# Patient Record
Sex: Male | Born: 1985 | Race: White | Hispanic: No | Marital: Single | Smoking: Current some day smoker
Health system: Southern US, Community
[De-identification: ages and names within clinical notes are randomized; demographics above are authoritative.]

## PROBLEM LIST (undated history)

## (undated) ENCOUNTER — Emergency Department (HOSPITAL_COMMUNITY): Discharge status: Absent without leave | Payer: MEDICAID

## (undated) DIAGNOSIS — F1090 Alcohol use, unspecified, uncomplicated: Secondary | ICD-10-CM

## (undated) DIAGNOSIS — F1410 Cocaine abuse, uncomplicated: Secondary | ICD-10-CM

## (undated) DIAGNOSIS — Z789 Other specified health status: Secondary | ICD-10-CM

## (undated) DIAGNOSIS — Z7289 Other problems related to lifestyle: Secondary | ICD-10-CM

---
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
## 2005-11-25 ENCOUNTER — Emergency Department (HOSPITAL_COMMUNITY): Admission: EM | Admit: 2005-11-25 | Discharge: 2005-11-25 | Payer: Self-pay | Admitting: Emergency Medicine

## 2007-02-01 ENCOUNTER — Emergency Department (HOSPITAL_COMMUNITY): Admission: EM | Admit: 2007-02-01 | Discharge: 2007-02-01 | Payer: Self-pay | Admitting: Emergency Medicine

## 2009-10-18 ENCOUNTER — Emergency Department: Payer: Self-pay | Admitting: Internal Medicine

## 2011-11-25 ENCOUNTER — Emergency Department (HOSPITAL_COMMUNITY)
Admission: EM | Admit: 2011-11-25 | Discharge: 2011-11-25 | Disposition: A | Discharge status: Home / Self Care | Payer: Self-pay | Attending: Emergency Medicine | Admitting: Emergency Medicine

## 2011-11-25 ENCOUNTER — Encounter (HOSPITAL_COMMUNITY): Payer: Self-pay

## 2011-11-25 DIAGNOSIS — F17200 Nicotine dependence, unspecified, uncomplicated: Secondary | ICD-10-CM | POA: Insufficient documentation

## 2011-11-25 DIAGNOSIS — N342 Other urethritis: Secondary | ICD-10-CM | POA: Insufficient documentation

## 2011-11-25 LAB — URINALYSIS, ROUTINE W REFLEX MICROSCOPIC
Hgb urine dipstick: NEGATIVE
pH: 7.5 (ref 5.0–8.0)

## 2011-11-25 MED ORDER — CEFTRIAXONE SODIUM 250 MG IJ SOLR
250.0000 mg | Freq: Once | INTRAMUSCULAR | Status: AC
  Administered 2011-11-25: 250 mg via INTRAMUSCULAR
  Filled 2011-11-25: qty 250

## 2011-11-25 MED ORDER — DOXYCYCLINE HYCLATE 100 MG PO TABS: 100.0000 mg | ORAL_TABLET | Freq: Two times a day (BID) | ORAL | Status: AC

## 2011-11-25 MED ORDER — DOXYCYCLINE HYCLATE 100 MG PO TABS
100.0000 mg | ORAL_TABLET | Freq: Once | ORAL | Status: AC
  Administered 2011-11-25: 100 mg via ORAL
  Filled 2011-11-25: qty 1

## 2011-11-25 NOTE — ED Notes (Signed)
Pain with urination and pain in left groin area. Pt states it feel like he is peeing glass

## 2011-11-25 NOTE — ED Notes (Signed)
C/o flank pain and painful with urination x 6 days, denies fever/chills, states feels like glass with urination

## 2011-11-25 NOTE — ED Provider Notes (Signed)
History     CSN: [id number]  Arrival date & time 11/25/11  1251   First MD Initiated Contact with Patient 11/25/11 1341      Chief Complaint  Patient presents with  . Dysuria     HPI Pt was seen at 1355.  Per pt, c/o gradual onset and persistence of constant dysuria for the past 3 days.  Describes his dysuria as "it feels like I'm peeing glass."  Has been associated with distal penile "pain."  States he has been taking his significant other's cipro for the past several days for his symptoms.  Denies testicular pain/swelling, no penile rash, no injury, no fevers, no abd pain, no back pain, no N/V/D.      History reviewed. No pertinent past medical history.  History reviewed. No pertinent past surgical history.   History  Substance Use Topics  . Smoking status: Current Everyday Smoker  . Smokeless tobacco: Not on file  . Alcohol Use: Yes    Review of Systems ROS: Statement: All systems negative except as marked or noted in the HPI; Constitutional: Negative for fever and chills. ; ; Eyes: Negative for eye pain, redness and discharge. ; ; ENMT: Negative for ear pain, hoarseness, nasal congestion, sinus pressure and sore throat. ; ; Cardiovascular: Negative for chest pain, palpitations, diaphoresis, dyspnea and peripheral edema. ; ; Respiratory: Negative for cough, wheezing and stridor. ; ; Gastrointestinal: Negative for nausea, vomiting, diarrhea, abdominal pain, blood in stool, hematemesis, jaundice and rectal bleeding. . ; ; Genitourinary: +dysuria. Negative for flank pain and hematuria. ; Genital:  No penile drainage or rash, no testicular pain or swelling, no scrotal rash or swelling. ;; Musculoskeletal: Negative for back pain and neck pain. Negative for swelling and trauma.; ; Skin: Negative for pruritus, rash, abrasions, blisters, bruising and skin lesion.; ; Neuro: Negative for headache, lightheadedness and neck stiffness. Negative for weakness, altered level of consciousness ,  altered mental status, extremity weakness, paresthesias, involuntary movement, seizure and syncope.     Allergies  Erythromycin  Home Medications   Current Outpatient Rx  Name Route Sig Dispense Refill  . DOXYCYCLINE HYCLATE 100 MG PO TABS Oral Take 1 tablet (100 mg total) by mouth 2 (two) times daily. 14 tablet 0    BP 145/63  Pulse 69  Temp 98.3 F (36.8 C) (Oral)  Resp 20  Ht 5\' 10"  (1.778 m)  Wt 198 lb (89.812 kg)  BMI 28.41 kg/m2  SpO2 100%  Physical Exam 1400: Physical examination:  Nursing notes reviewed; Vital signs and O2 SAT reviewed;  Constitutional: Well developed, Well nourished, Well hydrated, In no acute distress; Head:  Normocephalic, atraumatic; Eyes: EOMI, PERRL, No scleral icterus; ENMT: Mouth and pharynx normal, Mucous membranes moist; Neck: Supple, Full range of motion, No lymphadenopathy; Cardiovascular: Regular rate and rhythm, No gallop; Respiratory: Breath sounds clear & equal bilaterally, No wheezes.  Speaking full sentences with ease, Normal respiratory effort/excursion; Chest: Nontender, Movement normal; Abdomen: Soft, Nontender, Nondistended, Normal bowel sounds; Genitourinary: No CVA tenderness; Genital exam performed with pt permission and male ED Tech chaparone present during exam. No perineal erythema.  No penile lesions or rash.  +clear drainage from urethra.  No scrotal erythema, edema or tenderness to palp.  Normal testicular lie.  No testicular tenderness to palp.  +cremasteric reflexes bilat.  No inguinal LAN or palpable masses..;; Extremities: Pulses normal, No tenderness, No edema, No calf edema or asymmetry.; Neuro: AA&Ox3, Major CN grossly intact.  Speech clear. No gross focal motor  or sensory deficits in extremities.; Skin: Color normal, Warm, Dry.   ED Course  Procedures    MDM  MDM Reviewed: nursing note and vitals Interpretation: labs     Results for orders placed during the hospital encounter of 11/25/11  URINALYSIS, ROUTINE W  REFLEX MICROSCOPIC      Component Value Range   Color, Urine YELLOW  YELLOW   APPearance CLEAR  CLEAR   Specific Gravity, Urine 1.020  1.005 - 1.030   pH 7.5  5.0 - 8.0   Glucose, UA NEGATIVE  NEGATIVE mg/dL   Hgb urine dipstick NEGATIVE  NEGATIVE   Bilirubin Urine NEGATIVE  NEGATIVE   Ketones, ur NEGATIVE  NEGATIVE mg/dL   Protein, ur NEGATIVE  NEGATIVE mg/dL   Urobilinogen, UA 0.2  0.0 - 1.0 mg/dL   Nitrite NEGATIVE  NEGATIVE   Leukocytes, UA NEGATIVE  NEGATIVE  GC/CHLAMYDIA PROBE AMP, GENITAL      Component Value Range   GC Probe Amp, Genital NEGATIVE  NEGATIVE   Chlamydia, DNA Probe NEGATIVE  NEGATIVE      5:56 PM:  UC pending, no UTI on Udip. GC/chlam sent.  Will tx for urethritis.  Dx testing d/w pt and family.  Questions answered.  Verb understanding, agreeable to d/c home with outpt f/u.           Laray Anger, DO 11/26/11 1757

## 2011-11-26 ENCOUNTER — Encounter (HOSPITAL_COMMUNITY): Payer: Self-pay | Admitting: Emergency Medicine

## 2011-11-26 LAB — GC/CHLAMYDIA PROBE AMP, GENITAL
Chlamydia, DNA Probe: NEGATIVE
GC Probe Amp, Genital: NEGATIVE

## 2011-11-27 LAB — URINE CULTURE

## 2012-07-10 ENCOUNTER — Emergency Department: Payer: Self-pay | Admitting: Emergency Medicine

## 2012-07-10 LAB — COMPREHENSIVE METABOLIC PANEL
Alkaline Phosphatase: 86 U/L (ref 50–136)
Anion Gap: 5 — ABNORMAL LOW (ref 7–16)
BUN: 10 mg/dL (ref 7–18)
Bilirubin,Total: 0.4 mg/dL (ref 0.2–1.0)
Calcium, Total: 8.5 mg/dL (ref 8.5–10.1)
Co2: 27 mmol/L (ref 21–32)
EGFR (African American): >60
Glucose: 125 mg/dL — ABNORMAL HIGH (ref 65–99)
Osmolality: 271 (ref 275–301)
Potassium: 3.3 mmol/L — ABNORMAL LOW (ref 3.5–5.1)
SGOT(AST): 23 U/L (ref 15–37)
SGPT (ALT): 26 U/L (ref 12–78)
Total Protein: 7.7 g/dL (ref 6.4–8.2)

## 2012-07-10 LAB — URINALYSIS, COMPLETE
Bacteria: NONE SEEN
Bilirubin,UR: NEGATIVE
Leukocyte Esterase: NEGATIVE
Ph: 6 (ref 4.5–8.0)
Protein: NEGATIVE
Specific Gravity: 1.02 (ref 1.003–1.030)
Squamous Epithelial: <1
WBC UR: 1 /HPF (ref 0–5)

## 2012-07-10 LAB — CBC
MCV: 94 fL (ref 80–100)
Platelet: 238 10*3/uL (ref 150–440)
RBC: 4.65 10*6/uL (ref 4.40–5.90)
RDW: 13 % (ref 11.5–14.5)

## 2012-07-10 LAB — DIFFERENTIAL
Basophil %: 0.4 %
Eosinophil %: 0.1 %
Monocyte #: 1.1 x10 3/mm — ABNORMAL HIGH (ref 0.2–1.0)
Neutrophil %: 76 %

## 2013-05-28 ENCOUNTER — Encounter (HOSPITAL_COMMUNITY): Payer: Self-pay | Admitting: Emergency Medicine

## 2013-05-28 ENCOUNTER — Emergency Department (HOSPITAL_COMMUNITY)
Admission: EM | Admit: 2013-05-28 | Discharge: 2013-05-28 | Disposition: A | Discharge status: Home / Self Care | Payer: 59 | Attending: Emergency Medicine | Admitting: Emergency Medicine

## 2013-05-28 DIAGNOSIS — Z2089 Contact with and (suspected) exposure to other communicable diseases: Secondary | ICD-10-CM | POA: Insufficient documentation

## 2013-05-28 DIAGNOSIS — Z207 Contact with and (suspected) exposure to pediculosis, acariasis and other infestations: Secondary | ICD-10-CM

## 2013-05-28 MED ORDER — PERMETHRIN 5 % EX CREA: TOPICAL_CREAM | CUTANEOUS | Status: DC

## 2013-05-28 NOTE — ED Notes (Signed)
Rash to arms, groin and trunk. Recent exposure to scabies.

## 2013-05-31 NOTE — ED Provider Notes (Signed)
CSN: [id number]     Arrival date & time 05/28/13  1306 History   First MD Initiated Contact with Patient 05/28/13 1309     Chief Complaint  Patient presents with  . Rash   (Consider location/radiation/quality/duration/timing/severity/associated sxs/prior Treatment) Patient is a 28 y.o. male presenting with rash. The history is provided by the patient.  Rash Location:  Full body Quality: itchiness and redness   Quality: not blistering, not bruising, not burning, not painful, not swelling and not weeping   Severity:  Moderate Onset quality:  Gradual Duration:  4 days Timing:  Constant Progression:  Unchanged Chronicity:  New Context: exposure to similar rash   Context comment:  Patient's significant other also has similar rash.   Relieved by:  Nothing Worsened by:  Nothing tried Ineffective treatments:  None tried Associated symptoms: no abdominal pain, no fever, no headaches, no induration, no joint pain, no myalgias, no periorbital edema, no shortness of breath, no sore throat, no throat swelling, no tongue swelling, no URI, not vomiting and not wheezing     History reviewed. No pertinent past medical history. History reviewed. No pertinent past surgical history. Family History  Problem Relation Age of Onset  . Diabetes Father   . Hypertension Father   . Alzheimer's disease Other    History  Substance Use Topics  . Smoking status: Never Smoker   . Smokeless tobacco: Never Used  . Alcohol Use: Yes     Comment: occas    Review of Systems  Constitutional: Negative for fever, chills, activity change and appetite change.  HENT: Negative for facial swelling, sore throat and trouble swallowing.   Respiratory: Negative for chest tightness, shortness of breath and wheezing.   Gastrointestinal: Negative for vomiting and abdominal pain.  Genitourinary: Negative for dysuria, hematuria, discharge, penile swelling, scrotal swelling and testicular pain.  Musculoskeletal: Negative  for arthralgias, myalgias, neck pain and neck stiffness.  Skin: Positive for rash. Negative for wound.  Neurological: Negative for dizziness, weakness, numbness and headaches.  All other systems reviewed and are negative.    Allergies  Erythromycin  Home Medications   Current Outpatient Rx  Name  Route  Sig  Dispense  Refill  . permethrin (ELIMITE) 5 % cream      Apply from neck down, leave on for 10-12 hrs then wash off.  May re-apply in 7 days if needed   60 g   0    BP 128/69  Pulse 66  Temp(Src) 97.9 F (36.6 C) (Oral)  Resp 16  Ht 5\' 10"  (1.778 m)  Wt 182 lb (82.555 kg)  BMI 26.11 kg/m2  SpO2 100% Physical Exam  Nursing note and vitals reviewed. Constitutional: He is oriented to person, place, and time. He appears well-developed and well-nourished. No distress.  HENT:  Head: Normocephalic and atraumatic.  Mouth/Throat: Oropharynx is clear and moist.  Neck: Normal range of motion. Neck supple.  Cardiovascular: Normal rate, regular rhythm, normal heart sounds and intact distal pulses.   No murmur heard. Pulmonary/Chest: Effort normal and breath sounds normal. No respiratory distress. He has no wheezes. He has no rales.  Musculoskeletal: He exhibits no edema and no tenderness.  Lymphadenopathy:    He has no cervical adenopathy.  Neurological: He is alert and oriented to person, place, and time. He exhibits normal muscle tone. Coordination normal.  Skin: Skin is warm. Rash noted. There is erythema.  Scattered small erythematous papules to the trunk and bilateral upper and lower extremities, including hands.  No  edema, pustules or vesicles.       ED Course  Procedures (including critical care time) Labs Review Labs Reviewed - No data to display Imaging Review No results found.  EKG Interpretation   None       MDM   1. Scabies exposure    Patient is well appearing, VSS.  Significant other has similar rash and also presented to ED for treatment.  Will  prescribe permethrin cream.  Pt stable for discharge.      Fleurette Woolbright L. Exilda Wilhite, PA-C 05/31/13 1223

## 2013-05-31 NOTE — ED Provider Notes (Signed)
Medical screening examination/treatment/procedure(s) were performed by non-physician practitioner and as supervising physician I was immediately available for consultation/collaboration.  EKG Interpretation   None             Laray AngerKathleen M Jerime Arif, DO 05/31/13 1326

## 2013-07-14 ENCOUNTER — Emergency Department (HOSPITAL_COMMUNITY)
Admission: EM | Admit: 2013-07-14 | Discharge: 2013-07-14 | Disposition: A | Discharge status: Home / Self Care | Payer: 59 | Attending: Emergency Medicine | Admitting: Emergency Medicine

## 2013-07-14 ENCOUNTER — Encounter (HOSPITAL_COMMUNITY): Payer: Self-pay | Admitting: Emergency Medicine

## 2013-07-14 ENCOUNTER — Emergency Department (HOSPITAL_COMMUNITY): Payer: 59

## 2013-07-14 ENCOUNTER — Other Ambulatory Visit: Payer: Self-pay

## 2013-07-14 DIAGNOSIS — J329 Chronic sinusitis, unspecified: Secondary | ICD-10-CM | POA: Diagnosis not present

## 2013-07-14 DIAGNOSIS — R059 Cough, unspecified: Secondary | ICD-10-CM

## 2013-07-14 DIAGNOSIS — R05 Cough: Secondary | ICD-10-CM | POA: Diagnosis present

## 2013-07-14 DIAGNOSIS — Z79899 Other long term (current) drug therapy: Secondary | ICD-10-CM | POA: Insufficient documentation

## 2013-07-14 LAB — RAPID STREP SCREEN (MED CTR MEBANE ONLY): STREPTOCOCCUS, GROUP A SCREEN (DIRECT): NEGATIVE

## 2013-07-14 MED ORDER — AMOXICILLIN-POT CLAVULANATE 500-125 MG PO TABS: 1.0000 | ORAL_TABLET | Freq: Two times a day (BID) | ORAL | Status: DC

## 2013-07-14 MED ORDER — GUAIFENESIN-CODEINE 100-10 MG/5ML PO SYRP: 10.0000 mL | ORAL_SOLUTION | Freq: Three times a day (TID) | ORAL | Status: DC | PRN

## 2013-07-14 NOTE — ED Notes (Signed)
Pt c/o L sided chest pressure with SOB, onset this am. EKG completed per protocol.

## 2013-07-14 NOTE — ED Notes (Signed)
Pt c/o productive cough, nasal congestion, sore throat, headache and weakness.

## 2013-07-14 NOTE — Discharge Instructions (Signed)
Sinusitis Sinusitis is redness, soreness, and puffiness (inflammation) of the air pockets in the bones of your face (sinuses). The redness, soreness, and puffiness can cause air and mucus to get trapped in your sinuses. This can allow germs to grow and cause an infection.  HOME CARE   Drink enough fluids to keep your pee (urine) clear or pale yellow.  Use a humidifier in your home.  Run a hot shower to create steam in the bathroom. Sit in the bathroom with the door closed. Breathe in the steam 3 4 times a day.  Put a warm, moist washcloth on your face 3 4 times a day, or as told by your doctor.  Use salt water sprays (saline sprays) to wet the thick fluid in your nose. This can help the sinuses drain.  Only take medicine as told by your doctor. GET HELP RIGHT AWAY IF:   Your pain gets worse.  You have very bad headaches.  You are sick to your stomach (nauseous).  You throw up (vomit).  You are very sleepy (drowsy) all the time.  Your face is puffy (swollen).  Your vision changes.  You have a stiff neck.  You have trouble breathing. MAKE SURE YOU:   Understand these instructions.  Will watch your condition.  Will get help right away if you are not doing well or get worse. Document Released: 10/10/2007 Document Revised: 01/16/2012 Document Reviewed: 11/27/2011 [employer] Patient Information 2014 [employer].  

## 2013-07-14 NOTE — ED Provider Notes (Signed)
CSN: [id number]     Arrival date & time 07/14/13  1043 History   First MD Initiated Contact with Patient 07/14/13 1157     Chief Complaint  Patient presents with  . Cough     (Consider location/radiation/quality/duration/timing/severity/associated sxs/prior Treatment) Patient is a 28 y.o. male presenting with URI. The history is provided by the patient.  URI Presenting symptoms: congestion, cough, fatigue, rhinorrhea and sore throat   Presenting symptoms: no ear pain, no facial pain and no fever   Severity:  Mild Onset quality:  Gradual Duration:  3 days Timing:  Constant Progression:  Unchanged Chronicity:  New Relieved by:  Nothing Worsened by:  Nothing tried Ineffective treatments:  OTC medications Associated symptoms: headaches and myalgias   Associated symptoms: no arthralgias, no neck pain, no sinus pain, no sneezing, no swollen glands and no wheezing   Headaches:    Severity:  Mild   Onset quality:  Gradual   Timing:  Intermittent   Progression:  Waxing and waning   Chronicity:  New Myalgias:    Location:  Generalized   Quality:  Aching   Severity:  Mild   Timing:  Constant   Progression:  Unchanged Risk factors: sick contacts     History reviewed. No pertinent past medical history. History reviewed. No pertinent past surgical history. Family History  Problem Relation Age of Onset  . Diabetes Father   . Hypertension Father   . Alzheimer's disease Other    History  Substance Use Topics  . Smoking status: Never Smoker   . Smokeless tobacco: Never Used  . Alcohol Use: Yes     Comment: occas    Review of Systems  Constitutional: Positive for fatigue. Negative for fever, chills, activity change and appetite change.  HENT: Positive for congestion, rhinorrhea, sinus pressure and sore throat. Negative for ear pain, facial swelling, sneezing and trouble swallowing.   Eyes: Negative for visual disturbance.  Respiratory: Positive for cough. Negative for chest  tightness, shortness of breath, wheezing and stridor.   Cardiovascular: Negative for chest pain.  Gastrointestinal: Negative for nausea and vomiting.  Genitourinary: Negative for dysuria.  Musculoskeletal: Positive for myalgias. Negative for arthralgias, neck pain and neck stiffness.  Skin: Negative.  Negative for rash.  Neurological: Positive for headaches. Negative for dizziness, syncope, speech difficulty, weakness and numbness.  Hematological: Negative for adenopathy.  Psychiatric/Behavioral: Negative for confusion.  All other systems reviewed and are negative.      Allergies  Erythromycin  Home Medications   Current Outpatient Rx  Name  Route  Sig  Dispense  Refill  . guaiFENesin (MUCINEX) 600 MG 12 hr tablet   Oral   Take 600 mg by mouth 2 (two) times daily.         Marland Kitchen. amoxicillin-clavulanate (AUGMENTIN) 500-125 MG per tablet   Oral   Take 1 tablet (500 mg total) by mouth 2 (two) times daily. For 10 days   20 tablet   0   . guaiFENesin-codeine (ROBITUSSIN AC) 100-10 MG/5ML syrup   Oral   Take 10 mLs by mouth 3 (three) times daily as needed for cough.   120 mL   0    BP 121/75  Pulse 71  Temp(Src) 98 F (36.7 C) (Oral)  Resp 16  Ht 6' (1.829 m)  Wt 185 lb (83.915 kg)  BMI 25.08 kg/m2  SpO2 100% Physical Exam  Nursing note and vitals reviewed. Constitutional: He is oriented to person, place, and time. He appears well-developed and well-nourished.  No distress.  HENT:  Head: Normocephalic and atraumatic.  Right Ear: Tympanic membrane and ear canal normal.  Left Ear: Tympanic membrane and ear canal normal.  Nose: Mucosal edema and rhinorrhea present. Right sinus exhibits maxillary sinus tenderness and frontal sinus tenderness. Left sinus exhibits maxillary sinus tenderness and frontal sinus tenderness.  Mouth/Throat: Uvula is midline and mucous membranes are normal. No trismus in the jaw. No uvula swelling. Posterior oropharyngeal erythema present. No  oropharyngeal exudate, posterior oropharyngeal edema or tonsillar abscesses.  Eyes: Conjunctivae are normal.  Neck: Normal range of motion and phonation normal. Neck supple. No Brudzinski's sign and no Kernig's sign noted.  Cardiovascular: Normal rate, regular rhythm, normal heart sounds and intact distal pulses.   No murmur heard. Pulmonary/Chest: Effort normal and breath sounds normal. No respiratory distress. He has no wheezes. He has no rales.  Abdominal: Soft. He exhibits no distension. There is no tenderness. There is no rebound and no guarding.  Musculoskeletal: He exhibits no edema.  Lymphadenopathy:    He has no cervical adenopathy.  Neurological: He is alert and oriented to person, place, and time. He exhibits normal muscle tone. Coordination normal.  Skin: Skin is warm and dry.    ED Course  Procedures (including critical care time) Labs Review Labs Reviewed  RAPID STREP SCREEN  CULTURE, GROUP A STREP   Imaging Review Dg Chest 2 View  07/14/2013   CLINICAL DATA:  Cough and congestion.  Short of breath.  EXAM: CHEST  2 VIEW  COMPARISON:  DG RIBS UNILATERAL W/CHEST*L* dated 02/01/2007  FINDINGS: Cardiopericardial silhouette within normal limits. Mediastinal contours normal. Trachea midline. No airspace disease or effusion.  IMPRESSION: No active cardiopulmonary disease.   Electronically Signed   By: Andreas Newport M.D.   On: 07/14/2013 11:51     EKG Interpretation None       Date: 07/14/2013  Rate: 63  Rhythm: normal sinus rhythm and sinus arrhythmia  QRS Axis: normal  Intervals: normal  ST/T Wave abnormalities: normal  Conduction Disutrbances:none  Narrative Interpretation:   Old EKG Reviewed: none available    MDM   Final diagnoses:  Sinusitis  Cough     Pt is well appearing.  VSS.  No complaints of chest pain.  EKG done by triage.  No concerning sx's for PE or meningitis.  Sx's likely related to sinusitis.  Pt agrees to f/u with PMD.  appears stable for  d/c  The patient appears reasonably screened and/or stabilized for discharge and I doubt any other medical condition or other [facility] requiring further screening, evaluation, or treatment in the ED at this time prior to discharge.      Josslin Sanjuan L. Trisha Mangle, PA-C 07/15/13 2244

## 2013-07-16 LAB — CULTURE, GROUP A STREP

## 2013-07-16 NOTE — ED Provider Notes (Signed)
Medical screening examination/treatment/procedure(s) were performed by non-physician practitioner and as supervising physician I was immediately available for consultation/collaboration.   EKG Interpretation None        Jaxiel Kines L Kirsta Probert, MD 07/16/13 1535 

## 2014-03-01 ENCOUNTER — Emergency Department: Payer: Self-pay | Admitting: Emergency Medicine

## 2014-03-01 LAB — CBC WITH DIFFERENTIAL/PLATELET
Basophil #: 0 10*3/uL (ref 0.0–0.1)
Basophil %: 0.2 %
EOS ABS: 0 10*3/uL (ref 0.0–0.7)
EOS PCT: 0.3 %
HCT: 41.3 % (ref 40.0–52.0)
HGB: 13.3 g/dL (ref 13.0–18.0)
LYMPHS ABS: 2.1 10*3/uL (ref 1.0–3.6)
Lymphocyte %: 18.9 %
MCH: 31 pg (ref 26.0–34.0)
MCHC: 32.2 g/dL (ref 32.0–36.0)
MCV: 96 fL (ref 80–100)
MONO ABS: 1.5 x10 3/mm — AB (ref 0.2–1.0)
MONOS PCT: 13.5 %
Neutrophil #: 7.4 10*3/uL — ABNORMAL HIGH (ref 1.4–6.5)
Neutrophil %: 67.1 %
PLATELETS: 222 10*3/uL (ref 150–440)
RBC: 4.29 10*6/uL — AB (ref 4.40–5.90)
RDW: 12.8 % (ref 11.5–14.5)
WBC: 11 10*3/uL — ABNORMAL HIGH (ref 3.8–10.6)

## 2014-03-01 LAB — BASIC METABOLIC PANEL
ANION GAP: 6 — AB (ref 7–16)
BUN: 13 mg/dL (ref 7–18)
CHLORIDE: 98 mmol/L (ref 98–107)
CREATININE: 1.25 mg/dL (ref 0.60–1.30)
Calcium, Total: 8.4 mg/dL — ABNORMAL LOW (ref 8.5–10.1)
Co2: 32 mmol/L (ref 21–32)
EGFR (African American): >60
GLUCOSE: 106 mg/dL — AB (ref 65–99)
Osmolality: 272 (ref 275–301)
Potassium: 3.4 mmol/L — ABNORMAL LOW (ref 3.5–5.1)
SODIUM: 136 mmol/L (ref 136–145)

## 2014-03-04 ENCOUNTER — Emergency Department: Payer: Self-pay | Admitting: Emergency Medicine

## 2014-03-06 LAB — CULTURE, BLOOD (SINGLE)

## 2014-03-08 ENCOUNTER — Emergency Department: Payer: Self-pay | Admitting: Emergency Medicine

## 2014-03-08 LAB — CBC WITH DIFFERENTIAL/PLATELET
BASOS ABS: 0.1 10*3/uL (ref 0.0–0.1)
Basophil %: 0.9 %
EOS PCT: 1.5 %
Eosinophil #: 0.1 10*3/uL (ref 0.0–0.7)
HCT: 42.5 % (ref 40.0–52.0)
HGB: 14.4 g/dL (ref 13.0–18.0)
LYMPHS PCT: 37.1 %
Lymphocyte #: 3 10*3/uL (ref 1.0–3.6)
MCH: 32.7 pg (ref 26.0–34.0)
MCHC: 34 g/dL (ref 32.0–36.0)
MCV: 96 fL (ref 80–100)
Monocyte #: 0.7 x10 3/mm (ref 0.2–1.0)
Monocyte %: 8.2 %
NEUTROS ABS: 4.3 10*3/uL (ref 1.4–6.5)
Neutrophil %: 52.3 %
PLATELETS: 383 10*3/uL (ref 150–440)
RBC: 4.42 10*6/uL (ref 4.40–5.90)
RDW: 13 % (ref 11.5–14.5)
WBC: 8.2 10*3/uL (ref 3.8–10.6)

## 2014-03-24 ENCOUNTER — Emergency Department: Payer: Self-pay | Admitting: Internal Medicine

## 2014-03-24 LAB — ACETAMINOPHEN LEVEL: Acetaminophen: <2 ug/mL

## 2014-03-24 LAB — DRUG SCREEN, URINE
AMPHETAMINES, UR SCREEN: NEGATIVE (ref ?–1000)
BENZODIAZEPINE, UR SCRN: NEGATIVE (ref ?–200)
Barbiturates, Ur Screen: NEGATIVE (ref ?–200)
COCAINE METABOLITE, UR ~~LOC~~: POSITIVE (ref ?–300)
Cannabinoid 50 Ng, Ur ~~LOC~~: NEGATIVE (ref ?–50)
MDMA (ECSTASY) UR SCREEN: NEGATIVE (ref ?–500)
METHADONE, UR SCREEN: NEGATIVE (ref ?–300)
Opiate, Ur Screen: NEGATIVE (ref ?–300)
PHENCYCLIDINE (PCP) UR S: NEGATIVE (ref ?–25)
TRICYCLIC, UR SCREEN: NEGATIVE (ref ?–1000)

## 2014-03-24 LAB — CBC
HCT: 40 % (ref 40.0–52.0)
HGB: 13.5 g/dL (ref 13.0–18.0)
MCH: 32.3 pg (ref 26.0–34.0)
MCHC: 33.8 g/dL (ref 32.0–36.0)
MCV: 95 fL (ref 80–100)
PLATELETS: 280 10*3/uL (ref 150–440)
RBC: 4.19 10*6/uL — AB (ref 4.40–5.90)
RDW: 12.9 % (ref 11.5–14.5)
WBC: 8.6 10*3/uL (ref 3.8–10.6)

## 2014-03-24 LAB — COMPREHENSIVE METABOLIC PANEL
ALBUMIN: 3.7 g/dL (ref 3.4–5.0)
ALK PHOS: 70 U/L
AST: 20 U/L (ref 15–37)
Anion Gap: 9 (ref 7–16)
BILIRUBIN TOTAL: 0.2 mg/dL (ref 0.2–1.0)
BUN: 10 mg/dL (ref 7–18)
CALCIUM: 8.4 mg/dL — AB (ref 8.5–10.1)
CO2: 27 mmol/L (ref 21–32)
CREATININE: 1.41 mg/dL — AB (ref 0.60–1.30)
Chloride: 101 mmol/L (ref 98–107)
EGFR (African American): >60
EGFR (Non-African Amer.): >60
GLUCOSE: 143 mg/dL — AB (ref 65–99)
OSMOLALITY: 275 (ref 275–301)
Potassium: 3.2 mmol/L — ABNORMAL LOW (ref 3.5–5.1)
SGPT (ALT): 23 U/L
Sodium: 137 mmol/L (ref 136–145)
TOTAL PROTEIN: 7.1 g/dL (ref 6.4–8.2)

## 2014-03-24 LAB — URINALYSIS, COMPLETE
Bacteria: NONE SEEN
Bilirubin,UR: NEGATIVE
Glucose,UR: 50 mg/dL (ref 0–75)
Ketone: NEGATIVE
Leukocyte Esterase: NEGATIVE
Nitrite: NEGATIVE
Ph: 6 (ref 4.5–8.0)
Protein: NEGATIVE
RBC,UR: 1 /HPF (ref 0–5)
SPECIFIC GRAVITY: 1.005 (ref 1.003–1.030)
Squamous Epithelial: <1
WBC UR: 1 /HPF (ref 0–5)

## 2014-03-24 LAB — SALICYLATE LEVEL: Salicylates, Serum: 1.8 mg/dL

## 2014-03-24 LAB — ETHANOL

## 2014-10-13 ENCOUNTER — Emergency Department
Admission: EM | Admit: 2014-10-13 | Discharge: 2014-10-14 | Disposition: A | Discharge status: Home / Self Care | Payer: Self-pay | Attending: Emergency Medicine | Admitting: Emergency Medicine

## 2014-10-13 DIAGNOSIS — F1290 Cannabis use, unspecified, uncomplicated: Secondary | ICD-10-CM | POA: Insufficient documentation

## 2014-10-13 DIAGNOSIS — R443 Hallucinations, unspecified: Secondary | ICD-10-CM

## 2014-10-13 DIAGNOSIS — Z79899 Other long term (current) drug therapy: Secondary | ICD-10-CM | POA: Insufficient documentation

## 2014-10-13 DIAGNOSIS — F1990 Other psychoactive substance use, unspecified, uncomplicated: Secondary | ICD-10-CM

## 2014-10-13 MED ORDER — LORAZEPAM 2 MG PO TABS
2.0000 mg | ORAL_TABLET | Freq: Once | ORAL | Status: AC
  Administered 2014-10-13: 2 mg via ORAL

## 2014-10-13 MED ORDER — LORAZEPAM 2 MG PO TABS
ORAL_TABLET | ORAL | Status: AC
  Administered 2014-10-13: 2 mg via ORAL
  Filled 2014-10-13: qty 1

## 2014-10-13 MED ORDER — LORAZEPAM 2 MG PO TABS: 2.0000 mg | ORAL_TABLET | Freq: Once | ORAL | Status: DC

## 2014-10-13 NOTE — ED Notes (Addendum)
Pt brought to ED via [facility] from home for hallucinations. Pt admits to doing "acid" tonight and states he does it all the time. Pt's neighbor called EMS because pt was hallucinating. Pt not wanting to cooperating and refusing blood draw, IV, or ascultation. Pt not answering questions, triage, med/surg history, allergies incomplete.

## 2014-10-13 NOTE — ED Provider Notes (Signed)
[facility] Emergency Department Provider Note  ____________________________________________  Time seen: Approximately 11:21 PM  I have reviewed the triage vital signs and the nursing notes.   HISTORY  Chief Complaint Addiction Problem and Hallucinations  Limited by intoxication.  HPI Justin Chandler is a 29 y.o. male who presents via EMS from home for hallucinations. Patient's neighbor called for patient "doing acid" tonight at home with hallucinations. Patient uncooperative and unwilling to characterize nature of hallucinations. Patient avoids answeringquestions about self-harm.   No past medical history on file.  There are no active problems to display for this patient.   No past surgical history on file.  Current Outpatient Rx  Name  Route  Sig  Dispense  Refill  . amoxicillin-clavulanate (AUGMENTIN) 500-125 MG per tablet   Oral   Take 1 tablet (500 mg total) by mouth 2 (two) times daily. For 10 days   20 tablet   0   . guaiFENesin (MUCINEX) 600 MG 12 hr tablet   Oral   Take 600 mg by mouth 2 (two) times daily.         Marland Kitchen guaiFENesin-codeine (ROBITUSSIN AC) 100-10 MG/5ML syrup   Oral   Take 10 mLs by mouth 3 (three) times daily as needed for cough.   120 mL   0     Allergies Erythromycin  Family History  Problem Relation Age of Onset  . Diabetes Father   . Hypertension Father   . Alzheimer's disease Other     Social History History  Substance Use Topics  . Smoking status: Never Smoker   . Smokeless tobacco: Never Used  . Alcohol Use: Yes     Comment: occas    Review of Systems Constitutional: No fever/chills Eyes: No visual changes. ENT: No sore throat. Cardiovascular: Denies chest pain. Respiratory: Denies shortness of breath. Gastrointestinal: No abdominal pain.  No nausea, no vomiting.  No diarrhea.  No constipation. Genitourinary: Negative for dysuria. Musculoskeletal: Negative for back pain. Skin: Negative  for rash. Neurological: Negative for headaches, focal weakness or numbness. Psychiatric:Positive for mood swings.  Limited by intoxication, otherwise 10-point ROS otherwise negative.  ____________________________________________   PHYSICAL EXAM:  VITAL SIGNS: ED Triage Vitals  Enc Vitals Group     BP 10/13/14 2319 151/94 mmHg     Pulse Rate 10/13/14 2319 105     Resp 10/13/14 2319 20     Temp 10/13/14 2319 96.4 F (35.8 C)     Temp Source 10/13/14 2319 Tympanic     SpO2 10/13/14 2319 99 %     Weight 10/13/14 2319 180 lb (81.647 kg)     Height 10/13/14 2319  (1.803 m)     Head Cir --      Peak Flow --      Pain Score --      Pain Loc --      Pain Edu? --      Excl. in GC? --     Constitutional: Labile mood swings alternating between tearful and laughing.  Eyes: Conjunctivae are bloodshot bilaterally. PERRL. EOMI. Head: Atraumatic. Nose: No congestion/rhinnorhea. Mouth/Throat: Mucous membranes are moist.  Oropharynx non-erythematous. Neck: No stridor.   Cardiovascular: Tachycardic, regular rhythm. Grossly normal heart sounds.  Good peripheral circulation. Respiratory: Normal respiratory effort.  No retractions. Lungs CTAB. Gastrointestinal: Soft and nontender. No distention. No abdominal bruits. No CVA tenderness. Musculoskeletal: No lower extremity tenderness nor edema.  No joint effusions. Neurologic:  Normal speech and language. No gross focal  neurologic deficits are appreciated. Speech is normal. No gait instability. Skin:  Skin is warm, dry and intact. No rash noted. Psychiatric: Alternating between tearful and then laughing hysterically.  ____________________________________________   LABS (all labs ordered are listed, but only abnormal results are displayed)  Labs Reviewed  CBC - Abnormal; Notable for the following:    WBC 13.0 (*)    All other components within normal limits  COMPREHENSIVE METABOLIC PANEL - Abnormal; Notable for the following:     Glucose, Bld 134 (*)    Creatinine, Ser 1.26 (*)    All other components within normal limits  URINE DRUG SCREEN, QUALITATIVE ([facility] ONLY) - Abnormal; Notable for the following:    Cannabinoid 50 Ng, Ur [name] POSITIVE (*)    All other components within normal limits  URINALYSIS COMPLETEWITH MICROSCOPIC ([facility] ONLY) - Abnormal; Notable for the following:    Color, Urine COLORLESS (*)    APPearance CLEAR (*)    All other components within normal limits  ETHANOL  ACETAMINOPHEN LEVEL  SALICYLATE LEVEL   ____________________________________________  EKG  ED ECG REPORT I, [name], the attending physician, personally viewed and interpreted this ECG.   Date: 10/14/2014  EKG Time: 0307  Rate: 89  Rhythm: normal EKG, normal sinus rhythm  Axis: Normal  Intervals:none  ST&T Change: Nonspecific  ____________________________________________  RADIOLOGY  None ____________________________________________   PROCEDURES  Procedure(s) performed: None  Critical Care performed: No  ____________________________________________   INITIAL IMPRESSION / ASSESSMENT AND PLAN / ED COURSE  Pertinent labs & imaging results that were available during my care of the patient were reviewed by me and considered in my medical decision making (see chart for details).  29 year old male brought from home via EMS for hallucinations. Admits to "doing acid". Refusing interview and lab draw. Will try benzos to calm patient in order to complete evaluation.  0100 Ativan given with good effect. Patient more cooperative and gave urine specimen. Still refusing to give blood work. Will continue to monitor.  ----------------------------------------- 6:51 AM on 10/14/2014 -----------------------------------------  Patient subsequently agreed to blood work and was calm and cooperative throughout the night. He was evaluated by TTS and referred for outpatient follow-up. Patient denies SI/HI/AH/VH. Strict return  precautions given. Patient verbalizes understanding and agrees with plan of care. ____________________________________________   FINAL CLINICAL IMPRESSION(S) / ED DIAGNOSES  Final diagnoses:  Hallucinations  Substance use disorder  Marijuana use      Irean HongJade J Jasper Hanf, MD 10/14/14 [phone]

## 2014-10-13 NOTE — ED Notes (Signed)
MD Sung at bedside. 

## 2014-10-14 ENCOUNTER — Other Ambulatory Visit: Payer: Self-pay

## 2014-10-14 LAB — URINALYSIS COMPLETE WITH MICROSCOPIC (ARMC ONLY)
BILIRUBIN URINE: NEGATIVE
Bacteria, UA: NONE SEEN
Glucose, UA: NEGATIVE mg/dL
HGB URINE DIPSTICK: NEGATIVE
Ketones, ur: NEGATIVE mg/dL
Leukocytes, UA: NEGATIVE
Nitrite: NEGATIVE
PH: 7 (ref 5.0–8.0)
Protein, ur: NEGATIVE mg/dL
Specific Gravity, Urine: 1.005 (ref 1.005–1.030)
Squamous Epithelial / LPF: NONE SEEN

## 2014-10-14 LAB — COMPREHENSIVE METABOLIC PANEL
ALBUMIN: 4.6 g/dL (ref 3.5–5.0)
ALK PHOS: 59 U/L (ref 38–126)
ALT: 21 U/L (ref 17–63)
AST: 25 U/L (ref 15–41)
Anion gap: 9 (ref 5–15)
BUN: 13 mg/dL (ref 6–20)
CHLORIDE: 103 mmol/L (ref 101–111)
CO2: 27 mmol/L (ref 22–32)
Calcium: 9.4 mg/dL (ref 8.9–10.3)
Creatinine, Ser: 1.26 mg/dL — ABNORMAL HIGH (ref 0.61–1.24)
GFR calc Af Amer: >60 mL/min (ref >60)
GFR calc non Af Amer: >60 mL/min (ref >60)
Glucose, Bld: 134 mg/dL — ABNORMAL HIGH (ref 65–99)
Potassium: 3.8 mmol/L (ref 3.5–5.1)
Sodium: 139 mmol/L (ref 135–145)
Total Bilirubin: 0.5 mg/dL (ref 0.3–1.2)
Total Protein: 8 g/dL (ref 6.5–8.1)

## 2014-10-14 LAB — CBC
HCT: 46 % (ref 40.0–52.0)
Hemoglobin: 15.8 g/dL (ref 13.0–18.0)
MCH: 32.6 pg (ref 26.0–34.0)
MCHC: 34.4 g/dL (ref 32.0–36.0)
MCV: 94.7 fL (ref 80.0–100.0)
Platelets: 281 10*3/uL (ref 150–440)
RBC: 4.85 MIL/uL (ref 4.40–5.90)
RDW: 13.2 % (ref 11.5–14.5)
WBC: 13 10*3/uL — ABNORMAL HIGH (ref 3.8–10.6)

## 2014-10-14 LAB — ACETAMINOPHEN LEVEL: Acetaminophen (Tylenol), Serum: <10 ug/mL — ABNORMAL LOW (ref 10–30)

## 2014-10-14 LAB — URINE DRUG SCREEN, QUALITATIVE (ARMC ONLY)
Amphetamines, Ur Screen: NOT DETECTED
BARBITURATES, UR SCREEN: NOT DETECTED
Benzodiazepine, Ur Scrn: NOT DETECTED
Cannabinoid 50 Ng, Ur ~~LOC~~: POSITIVE — AB
Cocaine Metabolite,Ur ~~LOC~~: NOT DETECTED
MDMA (ECSTASY) UR SCREEN: NOT DETECTED
Methadone Scn, Ur: NOT DETECTED
OPIATE, UR SCREEN: NOT DETECTED
Phencyclidine (PCP) Ur S: NOT DETECTED
Tricyclic, Ur Screen: NOT DETECTED

## 2014-10-14 LAB — ETHANOL: Alcohol, Ethyl (B): <5 mg/dL (ref <5)

## 2014-10-14 LAB — SALICYLATE LEVEL

## 2014-10-14 NOTE — ED Notes (Signed)
Patient assigned to appropriate care area. Patient oriented to unit/care area: Informed that, for their safety, care areas are designed for safety and monitored by security cameras at all times; and visiting hours explained to patient. Patient verbalizes understanding, and verbal contract for safety obtained. 

## 2014-10-14 NOTE — ED Notes (Signed)
BEHAVIORAL HEALTH ROUNDING Patient sleeping: No. Patient alert and oriented: yes Behavior appropriate: Yes.  ; If no, describe:  Nutrition and fluids offered: Yes  Toileting and hygiene offered: Yes  Sitter present: no Law enforcement present: Yes, ODS 

## 2014-10-14 NOTE — ED Notes (Signed)
Margaret RN from [facility] came to see pt but pt is asleep. Margaret RN states she spoke with pt earlier but he would not cooperate or answer questions.

## 2014-10-14 NOTE — ED Notes (Signed)
MD aware pt still refuses any tests or cardiac monitoring. Pt remains calm and cooperative however.

## 2014-10-14 NOTE — ED Notes (Signed)
Pt lying in bed awake, bed and pt's clothes are soaked with urine. Got pt to get up and change soiled clothes, paper scrubs and clean linens provided. Pt and bed changed. Pt provided urine specimen but continues to refuse blood work. Pt reoriented to time and location and why EMS brought him to ED. Pt admits to doing acid and or "mushrooms" with his neighbor, states side effects were more intense than usual.

## 2014-10-14 NOTE — ED Notes (Signed)
Pt now consenting to blood draw and EKG.

## 2014-10-14 NOTE — ED Notes (Signed)

## 2014-10-14 NOTE — ED Notes (Signed)
Pt provided with sandwich tray and drink.  

## 2014-10-14 NOTE — ED Notes (Signed)
Pt resting in bed with eyes closed. No unusual behavior observed. Pt has no needs or concerns at this time. Will continue to monitor and f/u as needed.  
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
## 2014-10-14 NOTE — BHH Counselor (Signed)
Writer was unable to assess pt. Pt. Had discharge prior to being seen with TTS.  Per ER MD (Dr. Derrill Kay) the pt. doesn't need to be seen by Behavioral Medicine.

## 2014-10-14 NOTE — ED Notes (Signed)
BEHAVIORAL HEALTH ROUNDING Patient sleeping: Yes.   Patient alert and oriented: yes Behavior appropriate: Yes.  ; If no, describe:  Nutrition and fluids offered: Yes  Toileting and hygiene offered: Yes  Sitter present: no Law enforcement present: Yes  

## 2014-10-14 NOTE — ED Notes (Signed)
BEHAVIORAL HEALTH ROUNDING Patient sleeping: No. Patient alert and oriented: yes Behavior appropriate: Yes.  ; If no, describe:  Nutrition and fluids offered: Yes  Toileting and hygiene offered: Yes  Sitter present: no Law enforcement present: Yes  

## 2014-10-14 NOTE — Discharge Instructions (Signed)
1. Do not use illicit drugs.  2. Return to the ER for worsening symptoms, feelings of hurting yourself or others, or other concerns.  Chemical Dependency Chemical dependency is an addiction to drugs or alcohol. It is characterized by the repeated behavior of seeking out and using drugs and alcohol despite harmful consequences to the health and safety of ones self and others.  RISK FACTORS There are certain situations or behaviors that increase a person's risk for chemical dependency. These include:  A family history of chemical dependency.  A history of mental health issues, including depression and anxiety.  A home environment where drugs and alcohol are easily available to you.  Drug or alcohol use at a young age. SYMPTOMS  The following symptoms can indicate chemical dependency:  Inability to limit the use of drugs or alcohol.  Nausea, sweating, shakiness, and anxiety that occurs when alcohol or drugs are not being used.  An increase in amount of drugs or alcohol that is necessary to get drunk or high. People who experience these symptoms can assess their use of drugs and alcohol by asking themselves the following questions:  Have you been told by friends or family that they are worried about your use of alcohol or drugs?  Do friends and family ever tell you about things you did while drinking alcohol or using drugs that you do not remember?  Do you lie about using alcohol or drugs or about the amounts you use?  Do you have difficulty completing daily tasks unless you use alcohol or drugs?  Is the level of your work or school performance lower because of your drug or alcohol use?  Do you get sick from using drugs or alcohol but keep using anyway?  Do you feel uncomfortable in social situations unless you use alcohol or drugs?  Do you use drugs or alcohol to help forget problems? An answer of yes to any of these questions may indicate chemical dependency. Professional  evaluation is suggested. Document Released: 04/17/2001 Document Revised: 07/16/2011 Document Reviewed: 06/29/2010 [facility] Patient Information 2015 [place], [place]. This information is not intended to replace advice given to you by your health care provider. Make sure you discuss any questions you have with your health care provider.  Cannabis Use Disorder Cannabis use disorder is a mental disorder. It is not one-time or occasional use of cannabis, more commonly known as marijuana. Cannabis use disorder is the continued, nonmedical use of cannabis that interferes with normal life activities or causes health problems. People with cannabis use disorder get a feeling of extreme pleasure and relaxation from cannabis use. This "high" is very rewarding and causes people to use over and over.  The mind-altering ingredient in cannabis is know as THC. THC can also interfere with motor coordination, memory, judgment, and accurate sense of space and time. These effects can last for a few days after using cannabis. Regular heavy cannabis use can cause long-lasting problems with thinking and learning. In young people, these problems may be permanent. Cannabis sometimes causes severe anxiety, paranoia, or visual hallucinations. Man-made (synthetic) cannabis-like drugs, such as "spice" and "K2," cause the same effects as THC but are much stronger. Cannabis-like drugs can cause dangerously high blood pressure and heart rate.  Cannabis use disorder usually starts in the teenage years. It can trigger the development of schizophrenia. It is somewhat more common in men than women. People who have family members with the disorder or existing mental health issues such as depression and posttraumatic stress  disorderare more likely to develop cannabis use disorder. People with cannabis use disorder are at higher risk for use of other drugs of abuse.  SIGNS AND SYMPTOMS Signs and symptoms of cannabis use disorder include:   Use  of cannabis in larger amounts or over a longer period than intended.   Unsuccessful attempts to cut down or control cannabis use.   A lot of time spent obtaining, using, or recovering from the effects of cannabis.   A strong desire or urge to use cannabis (cravings).   Continued use of cannabis in spite of problems at work, school, or home because of use.   Continued use of cannabis in spite of relationship problems because of use.  Giving up or cutting down on important life activities because of cannabis use.  Use of cannabis over and over even in situations when it is physically hazardous, such as when driving a car.   Continued use of cannabis in spite of a physical problem that is likely related to use. Physical problems can include:  Chronic cough.  Bronchitis.  Emphysema.  Throat and lung cancer.  Continued use of cannabis in spite of a mental problem that is likely related to use. Mental problems can include:  Psychosis.  Anxiety.  Difficulty sleeping.  Need to use more and more cannabis to get the same effect, or lessened effect over time with use of the same amount (tolerance).  Having withdrawal symptoms when cannabis use is stopped, or using cannabis to reduce or avoid withdrawal symptoms. Withdrawal symptoms include:  Irritability or anger.  Anxiety or restlessness.  Difficulty sleeping.  Loss of appetite or weight.  Aches and pains.  Shakiness.  Sweating.  Chills. DIAGNOSIS Cannabis use disorder is diagnosed by your health care provider. You may be asked questions about your cannabis use and how it affects your life. A physical exam may be done. A drug screen may be done. You may be referred to a mental health professional. The diagnosis of cannabis use disorder requires at least two symptoms within 12 months. The type of cannabis use disorder you have depends on the number of symptoms you have. The type may be:  Mild. Two or three signs and  symptoms.   Moderate. Four or five signs and symptoms.   Severe. Six or more signs and symptoms.  TREATMENT Treatment is usually provided by mental health professionals with training in substance use disorders. The following options are available:  Counseling or talk therapy. Talk therapy addresses the reasons you use cannabis. It also addresses ways to keep you from using again. The goals of talk therapy include:  Identifying and avoiding triggers for use.  Learning how to handle cravings.  Replacing use with healthy activities.  Support groups. Support groups provide emotional support, advice, and guidance.  Medicine. Medicine is used to treat mental health issues that trigger cannabis use or that result from it. HOME CARE INSTRUCTIONS  Take medicines only as directed by your health care provider.  Check with your health care provider before starting any new medicines.  Keep all follow-up visits as directed by your health care provider. SEEK MEDICAL CARE IF:  You are not able to take your medicines as directed.  Your symptoms get worse. SEEK IMMEDIATE MEDICAL CARE IF: You have serious thoughts about hurting yourself or others. FOR MORE INFORMATION  National Institute on Drug Abuse: http://www.price-smith.com/  Substance Abuse and Mental Health Services Administration: SkateOasis.com.pt Document Released: 04/20/2000 Document Revised: 09/07/2013 Document Reviewed: 05/06/2013 ExitCare  Patient Information ©2015 [employer]. This information is not intended to replace advice given to you by your health care provider. Make sure you discuss any questions you have with your health care provider. ° °

## 2015-05-30 ENCOUNTER — Emergency Department (HOSPITAL_COMMUNITY)
Admission: EM | Admit: 2015-05-30 | Discharge: 2015-05-30 | Disposition: A | Discharge status: Home / Self Care | Payer: Self-pay | Attending: Emergency Medicine | Admitting: Emergency Medicine

## 2015-05-30 ENCOUNTER — Encounter (HOSPITAL_COMMUNITY): Payer: Self-pay | Admitting: *Deleted

## 2015-05-30 ENCOUNTER — Emergency Department (HOSPITAL_COMMUNITY): Payer: Self-pay

## 2015-05-30 DIAGNOSIS — Z79899 Other long term (current) drug therapy: Secondary | ICD-10-CM | POA: Insufficient documentation

## 2015-05-30 DIAGNOSIS — F17210 Nicotine dependence, cigarettes, uncomplicated: Secondary | ICD-10-CM | POA: Insufficient documentation

## 2015-05-30 DIAGNOSIS — R059 Cough, unspecified: Secondary | ICD-10-CM

## 2015-05-30 DIAGNOSIS — R05 Cough: Secondary | ICD-10-CM | POA: Insufficient documentation

## 2015-05-30 DIAGNOSIS — M546 Pain in thoracic spine: Secondary | ICD-10-CM | POA: Insufficient documentation

## 2015-05-30 DIAGNOSIS — R079 Chest pain, unspecified: Secondary | ICD-10-CM | POA: Insufficient documentation

## 2015-05-30 MED ORDER — ALBUTEROL SULFATE HFA 108 (90 BASE) MCG/ACT IN AERS
2.0000 | INHALATION_SPRAY | Freq: Once | RESPIRATORY_TRACT | Status: AC
  Administered 2015-05-30: 2 via RESPIRATORY_TRACT
  Filled 2015-05-30: qty 6.7

## 2015-05-30 MED ORDER — DOXYCYCLINE HYCLATE 100 MG PO TABS
100.0000 mg | ORAL_TABLET | Freq: Once | ORAL | Status: AC
  Administered 2015-05-30: 100 mg via ORAL
  Filled 2015-05-30: qty 1

## 2015-05-30 MED ORDER — DOXYCYCLINE HYCLATE 100 MG PO CAPS
100.0000 mg | ORAL_CAPSULE | Freq: Two times a day (BID) | ORAL | Status: DC
Start: 2015-05-30 — End: 2019-01-27

## 2015-05-30 NOTE — ED Notes (Signed)
RT called

## 2015-05-30 NOTE — Discharge Instructions (Signed)

## 2015-05-30 NOTE — ED Notes (Addendum)
Pt states he began coughing up blood in the mornings along with left mid-lower back pain x 4 days. Also states chills at times. NAD. Pt is a resident of [facility] in [place].

## 2015-06-04 NOTE — ED Provider Notes (Signed)
CSN: [id number]     Arrival date & time 05/30/15  1419 History   First MD Initiated Contact with Patient 05/30/15 1753     Chief Complaint  Patient presents with  . Hematemesis     (Consider location/radiation/quality/duration/timing/severity/associated sxs/prior Treatment) HPI   29yM with cough and L chest/L mid back pain. Onset 4 days ago. Persistent since then. Pain is worse with coughing and depressed. Does not feel short of breath. Subjective fever and chills. No unusual leg pain or swelling. Has noticed occasionally small amount of clear sputum occasionally tinged with blood. Denies past history of DVT/PE. No recent surgeries or immobilization.  History reviewed. No pertinent past medical history. History reviewed. No pertinent past surgical history. Family History  Problem Relation Age of Onset  . Diabetes Father   . Hypertension Father   . Alzheimer's disease Other    Social History  Substance Use Topics  . Smoking status: Current Some Day Smoker    Types: Cigarettes  . Smokeless tobacco: Never Used  . Alcohol Use: No    Review of Systems  All systems reviewed and negative, other than as noted in HPI.   Allergies  Erythromycin  Home Medications   Prior to Admission medications   Medication Sig Start Date End Date Taking? Authorizing Provider  amoxicillin-clavulanate (AUGMENTIN) 500-125 MG per tablet Take 1 tablet (500 mg total) by mouth 2 (two) times daily. For 10 days 07/14/13   Tammy Triplett, PA-C  doxycycline (VIBRAMYCIN) 100 MG capsule Take 1 capsule (100 mg total) by mouth 2 (two) times daily. 05/30/15   Raeford Razor, MD  guaiFENesin (MUCINEX) 600 MG 12 hr tablet Take 600 mg by mouth 2 (two) times daily.    Historical Provider, MD  guaiFENesin-codeine (ROBITUSSIN AC) 100-10 MG/5ML syrup Take 10 mLs by mouth 3 (three) times daily as needed for cough. 07/14/13   Tammy Triplett, PA-C   BP 136/81 mmHg  Pulse 95  Temp(Src) 99.1 F (37.3 C) (Oral)  Resp 16   Ht  (1.803 m)  Wt 198 lb (89.812 kg)  BMI 27.63 kg/m2  SpO2 97% Physical Exam  Constitutional: He appears well-developed and well-nourished. No distress.  HENT:  Head: Normocephalic and atraumatic.  Eyes: Conjunctivae are normal. Right eye exhibits no discharge. Left eye exhibits no discharge.  Neck: Neck supple.  Cardiovascular: Normal rate, regular rhythm and normal heart sounds.  Exam reveals no gallop and no friction rub.   No murmur heard. Pulmonary/Chest: Effort normal and breath sounds normal. No respiratory distress.  Abdominal: Soft. He exhibits no distension. There is no tenderness.  Musculoskeletal: He exhibits no edema or tenderness.  Lower extremities symmetric as compared to each other. No calf tenderness. Negative Homan's. No palpable cords.   Neurological: He is alert.  Skin: Skin is warm and dry.  Psychiatric: He has a normal mood and affect. His behavior is normal. Thought content normal.  Nursing note and vitals reviewed.   ED Course  Procedures (including critical care time) Labs Review Labs Reviewed - No data to display  Imaging Review No results found.   Dg Chest 2 View  05/30/2015  CLINICAL DATA:  Cough, congestion. Lower chest pain for 4 days. Hemoptysis. EXAM: CHEST  2 VIEW COMPARISON:  07/14/2013 FINDINGS: The heart size and mediastinal contours are within normal limits. Both lungs are clear. The visualized skeletal structures are unremarkable. IMPRESSION: No active cardiopulmonary disease. Electronically Signed   By: Charlett Nose M.D.   On: 05/30/2015 15:03  I have personally reviewed and evaluated these images and lab results as part of my medical decision-making.   EKG Interpretation None      MDM   Final diagnoses:  Cough  Left sided chest pain       Raeford Razor, MD 06/04/15 1943

## 2016-01-17 ENCOUNTER — Emergency Department (HOSPITAL_COMMUNITY)
Admission: EM | Admit: 2016-01-17 | Discharge: 2016-01-17 | Disposition: A | Discharge status: Home / Self Care | Payer: No Typology Code available for payment source | Attending: Emergency Medicine | Admitting: Emergency Medicine

## 2016-01-17 ENCOUNTER — Emergency Department (HOSPITAL_COMMUNITY): Payer: No Typology Code available for payment source

## 2016-01-17 DIAGNOSIS — S161XXA Strain of muscle, fascia and tendon at neck level, initial encounter: Secondary | ICD-10-CM | POA: Diagnosis not present

## 2016-01-17 DIAGNOSIS — Y999 Unspecified external cause status: Secondary | ICD-10-CM | POA: Insufficient documentation

## 2016-01-17 DIAGNOSIS — Y9389 Activity, other specified: Secondary | ICD-10-CM | POA: Insufficient documentation

## 2016-01-17 DIAGNOSIS — Y92410 Unspecified street and highway as the place of occurrence of the external cause: Secondary | ICD-10-CM | POA: Insufficient documentation

## 2016-01-17 DIAGNOSIS — F17210 Nicotine dependence, cigarettes, uncomplicated: Secondary | ICD-10-CM | POA: Insufficient documentation

## 2016-01-17 DIAGNOSIS — S199XXA Unspecified injury of neck, initial encounter: Secondary | ICD-10-CM | POA: Diagnosis present

## 2016-01-17 MED ORDER — METHOCARBAMOL 500 MG PO TABS: 500.0000 mg | ORAL_TABLET | Freq: Two times a day (BID) | ORAL | 0 refills | Status: DC

## 2016-01-17 MED ORDER — IBUPROFEN 800 MG PO TABS
800.0000 mg | ORAL_TABLET | Freq: Once | ORAL | Status: AC
  Administered 2016-01-17: 800 mg via ORAL
  Filled 2016-01-17: qty 1

## 2016-01-17 MED ORDER — IBUPROFEN 800 MG PO TABS: 800.0000 mg | ORAL_TABLET | Freq: Three times a day (TID) | ORAL | 0 refills | Status: DC

## 2016-01-17 NOTE — ED Notes (Signed)
Patient transported to X-ray 
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
## 2017-09-04 ENCOUNTER — Emergency Department (HOSPITAL_COMMUNITY)
Admission: EM | Admit: 2017-09-04 | Discharge: 2017-09-04 | Disposition: A | Discharge status: Home / Self Care | Payer: Self-pay | Attending: Emergency Medicine | Admitting: Emergency Medicine

## 2017-09-04 ENCOUNTER — Encounter (HOSPITAL_COMMUNITY): Payer: Self-pay | Admitting: *Deleted

## 2017-09-04 DIAGNOSIS — M545 Low back pain, unspecified: Secondary | ICD-10-CM

## 2017-09-04 DIAGNOSIS — F17210 Nicotine dependence, cigarettes, uncomplicated: Secondary | ICD-10-CM | POA: Insufficient documentation

## 2017-09-04 DIAGNOSIS — T148XXA Other injury of unspecified body region, initial encounter: Secondary | ICD-10-CM

## 2017-09-04 MED ORDER — NAPROXEN 500 MG PO TABS: 500.0000 mg | ORAL_TABLET | Freq: Two times a day (BID) | ORAL | 0 refills | Status: DC

## 2017-09-04 MED ORDER — METHOCARBAMOL 500 MG PO TABS: 500.0000 mg | ORAL_TABLET | Freq: Every evening | ORAL | 0 refills | Status: DC | PRN

## 2017-09-04 NOTE — Discharge Instructions (Signed)
Take naproxen 2 times a day with meals.  Do not take other anti-inflammatories at the same time open (Advil, Motrin, ibuprofen, Aleve). You may supplement with Tylenol if you need further pain control. Use muscle relaxer as needed for pain.  Use heating pads to help control your pain. Apply muscle creams (bengay, icy hot, salonpas) for pain relief. Try gentle stretches.  Return to the ER if you develop loss of bowel or bladder control, persistent numbness, inability to walk, or any new or concerning symptoms.

## 2017-09-04 NOTE — ED Triage Notes (Signed)
Pt in c/o back pain that started Monday after moving a lawnmower around- history of same several years. Pt ambulatory, pain worse with position change, pt denies radiating down his legs but states it runs into his hips bilaterally, no distress noted

## 2017-09-04 NOTE — ED Provider Notes (Signed)
[facility] EMERGENCY DEPARTMENT Provider Note   CSN: [id number] Arrival date & time: 09/04/17  0731     History   Chief Complaint Chief Complaint  Patient presents with  . Back Pain    HPI Justin Chandler is a 32 y.o. male presenting for evaluation of back pain.  Patient states he did increased activity on Monday when he was pushing a lawnmower.  Since then, he has had aggressively worsening back pain.  It is constant, worse with movements.  He has not taken anything for his pain including Tylenol or ibuprofen.  He reports similar pain several years ago.  He denies fall, trauma, or injury.  He denies fevers, chills, rash, loss of bowel or bladder control, numbness, or tingling.  He denies history of cancer or history of IV drug use.  He states he has no medical problems, does not take medications daily.  He denies urinary symptoms including frequency, hematuria, or dysuria.  He denies radiation into his legs.   HPI  History reviewed. No pertinent past medical history.  There are no active problems to display for this patient.   History reviewed. No pertinent surgical history.      Home Medications    Prior to Admission medications   Medication Sig Start Date End Date Taking? Authorizing Provider  amoxicillin-clavulanate (AUGMENTIN) 500-125 MG per tablet Take 1 tablet (500 mg total) by mouth 2 (two) times daily. For 10 days 07/14/13   Triplett, Tammy, PA-C  doxycycline (VIBRAMYCIN) 100 MG capsule Take 1 capsule (100 mg total) by mouth 2 (two) times daily. 05/30/15   Raeford Razor, MD  guaiFENesin (MUCINEX) 600 MG 12 hr tablet Take 600 mg by mouth 2 (two) times daily.    [provider]  guaiFENesin-codeine (ROBITUSSIN AC) 100-10 MG/5ML syrup Take 10 mLs by mouth 3 (three) times daily as needed for cough. 07/14/13   Triplett, Tammy, PA-C  ibuprofen (ADVIL,MOTRIN) 800 MG tablet Take 1 tablet (800 mg total) by mouth 3 (three) times daily. 01/17/16   Fayrene Helper, PA-C  methocarbamol (ROBAXIN) 500 MG tablet Take 1 tablet (500 mg total) by mouth at bedtime as needed for muscle spasms. 09/04/17   Saphronia Ozdemir, PA-C  naproxen (NAPROSYN) 500 MG tablet Take 1 tablet (500 mg total) by mouth 2 (two) times daily with a meal. 09/04/17   Ringo Sherod, PA-C    Family History Family History  Problem Relation Age of Onset  . Diabetes Father   . Hypertension Father   . Alzheimer's disease Other     Social History Social History   Tobacco Use  . Smoking status: Current Some Day Smoker    Types: Cigarettes  . Smokeless tobacco: Never Used  Substance Use Topics  . Alcohol use: No  . Drug use: No     Allergies   Erythromycin   Review of Systems Review of Systems  Musculoskeletal: Positive for back pain.  Neurological: Negative for numbness.     Physical Exam Updated Vital Signs BP 128/86 (BP Location: Right Arm)   Pulse (!) 54   Temp 98.5 F (36.9 C) (Oral)   Resp 20   SpO2 100%   Physical Exam  Constitutional: He is oriented to person, place, and time. He appears well-developed and well-nourished. No distress.  HENT:  Head: Normocephalic and atraumatic.  Eyes: EOM are normal.  Neck: Normal range of motion.  Cardiovascular: Normal rate, regular rhythm and intact distal pulses.  Pulmonary/Chest: Effort normal and breath sounds  normal. No respiratory distress. He has no wheezes.  Abdominal: He exhibits no distension.  Musculoskeletal: Normal range of motion. He exhibits tenderness.  Tenderness to palpation of bilateral low back musculature, worse on the left side.  No tenderness to palpation over midline spine.  No obvious deformities or step-offs.  Strength of upper and lower extremity is intact bilaterally.  Radial and pedal pulses equal bilaterally.  Sensation intact bilaterally.  Patellar reflexes equal.  Patient is ambulatory.  No saddle paresthesia.  Neurological: He is alert and oriented to person, place, and time. He  displays normal reflexes. No sensory deficit.  Skin: Skin is warm. No rash noted.  Psychiatric: He has a normal mood and affect.  Nursing note and vitals reviewed.    ED Treatments / Results  Labs (all labs ordered are listed, but only abnormal results are displayed) Labs Reviewed - No data to display  EKG None  Radiology No results found.  Procedures Procedures (including critical care time)  Medications Ordered in ED Medications - No data to display   Initial Impression / Assessment and Plan / ED Course  I have reviewed the triage vital signs and the nursing notes.  Pertinent labs & imaging results that were available during my care of the patient were reviewed by me and considered in my medical decision making (see chart for details).     Patient presenting for evaluation of back pain.  Physical exam reassuring, no obvious neurologic deficits.  No red flags of back pain.  Pain reproducible with palpation of the musculature.  Likely muscle strain.  Discussed findings with patient.  Discussed treatment with NSAIDs, muscle relaxers, muscle creams.  Discussed heat and stretching.  At this time, doubt vertebral injury, fracture, infection, spinal cord compression, myelopathy, cauda equina syndrome.  At this time, patient appears safe for discharge.  Return precautions given.  Patient states he understands and agrees to plan.  Final Clinical Impressions(s) / ED Diagnoses   Final diagnoses:  Acute left-sided low back pain without sciatica  Muscle strain    ED Discharge Orders        Ordered    methocarbamol (ROBAXIN) 500 MG tablet  At bedtime PRN     09/04/17 0829    naproxen (NAPROSYN) 500 MG tablet  2 times daily with meals     09/04/17 0829       Alveria Apley, PA-C 09/04/17 1025    Little, Ambrose Finland, MD 09/04/17 1547

## 2019-01-27 ENCOUNTER — Emergency Department (HOSPITAL_COMMUNITY)
Admission: EM | Admit: 2019-01-27 | Discharge: 2019-01-27 | Disposition: A | Discharge status: Home / Self Care | Payer: No Typology Code available for payment source | Attending: Emergency Medicine | Admitting: Emergency Medicine

## 2019-01-27 ENCOUNTER — Emergency Department (HOSPITAL_COMMUNITY): Payer: No Typology Code available for payment source

## 2019-01-27 ENCOUNTER — Other Ambulatory Visit: Payer: Self-pay

## 2019-01-27 ENCOUNTER — Encounter (HOSPITAL_COMMUNITY): Payer: Self-pay | Admitting: Emergency Medicine

## 2019-01-27 DIAGNOSIS — M25511 Pain in right shoulder: Secondary | ICD-10-CM | POA: Insufficient documentation

## 2019-01-27 DIAGNOSIS — Y999 Unspecified external cause status: Secondary | ICD-10-CM | POA: Insufficient documentation

## 2019-01-27 DIAGNOSIS — Y93I9 Activity, other involving external motion: Secondary | ICD-10-CM | POA: Diagnosis not present

## 2019-01-27 DIAGNOSIS — F17210 Nicotine dependence, cigarettes, uncomplicated: Secondary | ICD-10-CM | POA: Insufficient documentation

## 2019-01-27 DIAGNOSIS — Y92410 Unspecified street and highway as the place of occurrence of the external cause: Secondary | ICD-10-CM | POA: Diagnosis not present

## 2019-01-27 DIAGNOSIS — T148XXA Other injury of unspecified body region, initial encounter: Secondary | ICD-10-CM

## 2019-01-27 MED ORDER — METHOCARBAMOL 750 MG PO TABS: 750.0000 mg | ORAL_TABLET | Freq: Three times a day (TID) | ORAL | 0 refills | Status: AC | PRN

## 2019-01-27 MED ORDER — IBUPROFEN 800 MG PO TABS: 800.0000 mg | ORAL_TABLET | Freq: Three times a day (TID) | ORAL | 0 refills | Status: DC

## 2019-01-27 MED ORDER — IBUPROFEN 800 MG PO TABS
800.0000 mg | ORAL_TABLET | Freq: Once | ORAL | Status: AC
  Administered 2019-01-27: 16:00:00 800 mg via ORAL
  Filled 2019-01-27: qty 1
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
## 2019-01-27 NOTE — Discharge Instructions (Addendum)
Use the medicines prescribed for inflammation and muscle spasm.  A heating pad applied to your areas of discomfort for 20 minutes several times daily may help speed up recovery.  Get rechecked if not improving over the next 7-10 days.  Your xrays are negative for any acute injury but as discussed you may need further testing if your symptoms do not improve to assess for tendon or rotator cuff injuries.
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
## 2019-06-15 ENCOUNTER — Emergency Department (HOSPITAL_COMMUNITY): Payer: Self-pay

## 2019-06-15 ENCOUNTER — Observation Stay (HOSPITAL_COMMUNITY)
Admission: EM | Admit: 2019-06-15 | Discharge: 2019-06-16 | Disposition: A | Discharge status: Home / Self Care | Payer: Self-pay | Attending: Emergency Medicine | Admitting: Emergency Medicine

## 2019-06-15 ENCOUNTER — Other Ambulatory Visit: Payer: Self-pay

## 2019-06-15 ENCOUNTER — Encounter (HOSPITAL_COMMUNITY): Payer: Self-pay

## 2019-06-15 DIAGNOSIS — Z20822 Contact with and (suspected) exposure to covid-19: Secondary | ICD-10-CM | POA: Insufficient documentation

## 2019-06-15 DIAGNOSIS — F17210 Nicotine dependence, cigarettes, uncomplicated: Secondary | ICD-10-CM | POA: Insufficient documentation

## 2019-06-15 DIAGNOSIS — I340 Nonrheumatic mitral (valve) insufficiency: Secondary | ICD-10-CM | POA: Insufficient documentation

## 2019-06-15 DIAGNOSIS — R072 Precordial pain: Principal | ICD-10-CM | POA: Insufficient documentation

## 2019-06-15 DIAGNOSIS — Z8249 Family history of ischemic heart disease and other diseases of the circulatory system: Secondary | ICD-10-CM | POA: Insufficient documentation

## 2019-06-15 DIAGNOSIS — R079 Chest pain, unspecified: Secondary | ICD-10-CM

## 2019-06-15 DIAGNOSIS — E785 Hyperlipidemia, unspecified: Secondary | ICD-10-CM | POA: Insufficient documentation

## 2019-06-15 LAB — CBC
HCT: 43.9 % (ref 39.0–52.0)
Hemoglobin: 15.3 g/dL (ref 13.0–17.0)
MCH: 33 pg (ref 26.0–34.0)
MCHC: 34.9 g/dL (ref 30.0–36.0)
MCV: 94.8 fL (ref 80.0–100.0)
Platelets: 281 10*3/uL (ref 150–400)
RBC: 4.63 MIL/uL (ref 4.22–5.81)
RDW: 12.2 % (ref 11.5–15.5)
WBC: 9.6 10*3/uL (ref 4.0–10.5)
nRBC: 0 % (ref 0.0–0.2)

## 2019-06-15 LAB — RAPID URINE DRUG SCREEN, HOSP PERFORMED
Amphetamines: NOT DETECTED
Barbiturates: NOT DETECTED
Benzodiazepines: NOT DETECTED
Cocaine: NOT DETECTED
Opiates: NOT DETECTED
Tetrahydrocannabinol: POSITIVE — AB

## 2019-06-15 LAB — TROPONIN I (HIGH SENSITIVITY)
Troponin I (High Sensitivity): 3 ng/L (ref <18)
Troponin I (High Sensitivity): 3 ng/L (ref <18)

## 2019-06-15 LAB — RESPIRATORY PANEL BY RT PCR (FLU A&B, COVID)
Influenza A by PCR: NEGATIVE
Influenza B by PCR: NEGATIVE
SARS Coronavirus 2 by RT PCR: NEGATIVE

## 2019-06-15 LAB — BASIC METABOLIC PANEL
Anion gap: 10 (ref 5–15)
BUN: 16 mg/dL (ref 6–20)
CO2: 27 mmol/L (ref 22–32)
Calcium: 9.2 mg/dL (ref 8.9–10.3)
Chloride: 102 mmol/L (ref 98–111)
Creatinine, Ser: 1.16 mg/dL (ref 0.61–1.24)
GFR calc Af Amer: >60 mL/min (ref >60)
GFR calc non Af Amer: >60 mL/min (ref >60)
Glucose, Bld: 96 mg/dL (ref 70–99)
Potassium: 3.3 mmol/L — ABNORMAL LOW (ref 3.5–5.1)
Sodium: 139 mmol/L (ref 135–145)

## 2019-06-15 LAB — HIV ANTIBODY (ROUTINE TESTING W REFLEX): HIV Screen 4th Generation wRfx: NONREACTIVE

## 2019-06-15 MED ORDER — ENOXAPARIN SODIUM 40 MG/0.4ML ~~LOC~~ SOLN
40.0000 mg | SUBCUTANEOUS | Status: DC
  Administered 2019-06-15: 40 mg via SUBCUTANEOUS
  Filled 2019-06-15: qty 0.4

## 2019-06-15 MED ORDER — SODIUM CHLORIDE 0.9% FLUSH
3.0000 mL | Freq: Once | INTRAVENOUS | Status: AC
  Administered 2019-06-15: 18:00:00 3 mL via INTRAVENOUS

## 2019-06-15 MED ORDER — ASPIRIN 81 MG PO CHEW
324.0000 mg | CHEWABLE_TABLET | Freq: Once | ORAL | Status: AC
  Administered 2019-06-15: 18:00:00 324 mg via ORAL
  Filled 2019-06-15: qty 4

## 2019-06-15 MED ORDER — SODIUM CHLORIDE 0.9 % IV SOLN: INTRAVENOUS | Status: DC

## 2019-06-15 MED ORDER — ASPIRIN EC 81 MG PO TBEC
81.0000 mg | DELAYED_RELEASE_TABLET | Freq: Every day | ORAL | Status: DC
  Administered 2019-06-15 – 2019-06-16 (×2): 81 mg via ORAL
  Filled 2019-06-15 (×2): qty 1

## 2019-06-15 NOTE — ED Provider Notes (Signed)
Emergency Department Provider Note   I have reviewed the triage vital signs and the nursing notes.   HISTORY  Chief Complaint Chest Pain   HPI Justin Chandler is a 34 y.o. 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  States he had some diaphoresis but that he was working hard in it that did not feel abnormal.  He did not feel nauseated but did feel somewhat lightheaded and generalized weakness.  He has had intermittent chest discomfort over the past 2 years but today's episode seemed more severe which prompted his ED presentation.  He denies fevers or shortness of breath.  No URI symptoms.  Patient states that he stopped using IV drugs 2 years prior and does not use any illicit drugs at this time.  He has not experienced shortness of breath or leg swelling.    Patient also notes that as an aside he has a rash to his right hip area which was benign there intermittently over the past 3 months.  He denies fever or rapid progression.  No drainage from the area.  It is somewhat tender with touching the area.   History reviewed. No pertinent past medical history.  There are no problems to display for this patient.   History reviewed. No pertinent surgical history.  Allergies Erythromycin  Family History  Problem Relation Age of Onset  . Diabetes Father   . Hypertension Father   . Alzheimer's disease Other     Social History Social History   Tobacco Use  . Smoking status: Current Some Day Smoker    Packs/day: 1.00    Types: Cigarettes  . Smokeless tobacco: Never Used  Substance Use Topics  . Alcohol use: No  . Drug use: No    Review of Systems  Constitutional: No fever/chills Eyes: No visual  changes. ENT: No sore throat. Cardiovascular: Positive chest pain.  Respiratory: Denies shortness of breath.  Gastrointestinal: No abdominal pain.  No nausea, no vomiting.  No diarrhea.  No constipation. Genitourinary: Negative for dysuria. Musculoskeletal: Negative for back pain. Skin: Rash to the right groin.  Neurological: Negative for headaches, focal weakness or numbness.  10-point ROS otherwise negative.  ____________________________________________   PHYSICAL EXAM:  VITAL SIGNS: ED Triage Vitals  Enc Vitals Group     BP 06/15/19 1410 (!) 152/91     Pulse Rate 06/15/19 1410 (!) 102     Resp 06/15/19 1410 18     Temp 06/15/19 1410 98.3 F (36.8 C)     Temp Source 06/15/19 1410 Oral     SpO2 06/15/19 1410 100 %     Weight 06/15/19 1418 215 lb (97.5 kg)     Height 06/15/19 1418 5\' 11"  (1.803 m)   Constitutional: Alert and oriented. Well appearing and in no acute distress. Eyes: Conjunctivae are normal.  Head: Atraumatic. Nose: No congestion/rhinnorhea. Mouth/Throat: Mucous membranes are moist.  Neck: No stridor.   Cardiovascular: Tachycardia. Good peripheral circulation. Grossly normal heart sounds.   Respiratory: Normal respiratory effort.  No retractions. Lungs CTAB. Gastrointestinal: Soft and nontender. No distention.  Musculoskeletal: No lower extremity tenderness nor edema. No gross deformities of extremities. Neurologic:  Normal speech and language.  Skin:  Skin is warm, dry and intact. Mild erythema over the right anterior hip without abscess.    ____________________________________________   LABS (all labs ordered are listed, but only abnormal results are displayed)  Labs Reviewed  BASIC METABOLIC PANEL - Abnormal; Notable for the following components:      Result Value   Potassium 3.3 (*)    All other components within normal limits  RESPIRATORY PANEL BY RT PCR (FLU A&B, COVID)  CBC  HIV ANTIBODY (ROUTINE TESTING W REFLEX)  RPR  RAPID URINE DRUG  SCREEN, HOSP PERFORMED  TROPONIN I (HIGH SENSITIVITY)  TROPONIN I (HIGH SENSITIVITY)   ____________________________________________  EKG   EKG Interpretation  Date/Time:  Monday June 15 2019 14:15:16 EST Ventricular Rate:  104 PR Interval:    QRS Duration: 91 QT Interval:  335 QTC Calculation: 441 R Axis:   80 Text Interpretation: Sinus tachycardia Abnormal T, consider ischemia, diffuse leads Baseline wander in lead(s) V1 V2 No STEMI Confirmed by Nanda Quinton [phone]) on 06/15/2019 5:17:58 PM       ____________________________________________  RADIOLOGY  DG Chest 2 View  Result Date: 06/15/2019 CLINICAL DATA:  Chest pain EXAM: CHEST - 2 VIEW COMPARISON:  May 30, 2015 FINDINGS: Lungs are clear. Heart size and pulmonary vascularity are normal. No adenopathy. No pneumothorax. No bone lesions. IMPRESSION: No abnormality noted. Electronically Signed   By: Lowella Grip III M.D.   On: 06/15/2019 15:02    ____________________________________________   PROCEDURES  Procedure(s) performed:   Procedures  None  ____________________________________________   INITIAL IMPRESSION / ASSESSMENT AND PLAN / ED COURSE  Pertinent labs & imaging results that were available during my care of the patient were reviewed by me and considered in my medical decision making (see chart for details).   [facility] emergency department for evaluation of chest discomfort intermittently over the past 2 years but worsening today.  No active pain but EKG shows show diffuse T wave changes when compared from prior EKG in 2016.  No hypertension here.  Possible LVH.  High-sensitivity troponin here is normal.  Given his intermittent pain with EKG change I will discussed the case with cardiology.  He does have family history of heart attack with his father having a heart attack and dying at the age of 75. No active drug use. Will discuss with Cardiology with EKG change and family history. HEART score 4.    06:22 PM  Spoke with Dr. Percival Spanish with cardiology.  He has reviewed the EKG along with history and vital signs.  I have added a urine drug screen.  Given the patient's family history and story today he states that it would not be unreasonable to observe the patient in the hospital and obtain a coronary artery CT in the morning.  This can be done at [facility].   Discussed patient's case with [facility] to request admission. Patient and family (if present) updated with plan. Care transferred to [employer] service.  I reviewed all nursing notes, vitals, pertinent old records, EKGs, labs, imaging (as available).  ____________________________________________  FINAL CLINICAL IMPRESSION(S) / ED DIAGNOSES  Final diagnoses:  Precordial chest pain    MEDICATIONS GIVEN DURING THIS VISIT:  Medications  sodium chloride flush (NS) 0.9 % injection 3 mL (3 mLs Intravenous Given 06/15/19 1742)  aspirin chewable tablet 324 mg (324 mg Oral Given 06/15/19 1741)    Note:  This document was prepared using Dragon voice recognition software and may include unintentional dictation errors.  Nanda Quinton, MD,  [facility] Emergency Medicine    Crue Otero, Arlyss Repress, MD 06/15/19 1850

## 2019-06-15 NOTE — ED Notes (Signed)
Pt made aware urine sample needed 

## 2019-06-15 NOTE — ED Triage Notes (Signed)
Patient c/o chest tightness that radiated into the neck around noon today. Patient states he has had this situation for the past  2 years.

## 2019-06-16 ENCOUNTER — Encounter (HOSPITAL_COMMUNITY): Payer: Self-pay | Admitting: Emergency Medicine

## 2019-06-16 ENCOUNTER — Encounter (HOSPITAL_COMMUNITY): Payer: Self-pay

## 2019-06-16 ENCOUNTER — Other Ambulatory Visit: Payer: Self-pay

## 2019-06-16 ENCOUNTER — Observation Stay (HOSPITAL_COMMUNITY)
Admit: 2019-06-16 | Discharge: 2019-06-16 | Disposition: A | Discharge status: Home / Self Care | Payer: Self-pay | Attending: Emergency Medicine | Admitting: Emergency Medicine

## 2019-06-16 ENCOUNTER — Observation Stay (HOSPITAL_BASED_OUTPATIENT_CLINIC_OR_DEPARTMENT_OTHER): Payer: Self-pay

## 2019-06-16 DIAGNOSIS — R079 Chest pain, unspecified: Secondary | ICD-10-CM

## 2019-06-16 DIAGNOSIS — R072 Precordial pain: Secondary | ICD-10-CM

## 2019-06-16 LAB — BASIC METABOLIC PANEL
Anion gap: 7 (ref 5–15)
BUN: 16 mg/dL (ref 6–20)
CO2: 27 mmol/L (ref 22–32)
Calcium: 8.4 mg/dL — ABNORMAL LOW (ref 8.9–10.3)
Chloride: 104 mmol/L (ref 98–111)
Creatinine, Ser: 1.13 mg/dL (ref 0.61–1.24)
GFR calc Af Amer: >60 mL/min (ref >60)
GFR calc non Af Amer: >60 mL/min (ref >60)
Glucose, Bld: 91 mg/dL (ref 70–99)
Potassium: 3.8 mmol/L (ref 3.5–5.1)
Sodium: 138 mmol/L (ref 135–145)

## 2019-06-16 LAB — ECHOCARDIOGRAM COMPLETE
Height: 71 in
Weight: 3440 oz

## 2019-06-16 LAB — CBC
HCT: 42.1 % (ref 39.0–52.0)
Hemoglobin: 14 g/dL (ref 13.0–17.0)
MCH: 32.3 pg (ref 26.0–34.0)
MCHC: 33.3 g/dL (ref 30.0–36.0)
MCV: 97.2 fL (ref 80.0–100.0)
Platelets: 269 10*3/uL (ref 150–400)
RBC: 4.33 MIL/uL (ref 4.22–5.81)
RDW: 12.5 % (ref 11.5–15.5)
WBC: 5.4 10*3/uL (ref 4.0–10.5)
nRBC: 0 % (ref 0.0–0.2)

## 2019-06-16 LAB — LIPID PANEL
Cholesterol: 192 mg/dL (ref 0–200)
HDL: 44 mg/dL (ref >40)
LDL Cholesterol: 129 mg/dL — ABNORMAL HIGH (ref 0–99)
Total CHOL/HDL Ratio: 4.4 RATIO
Triglycerides: 95 mg/dL (ref <150)
VLDL: 19 mg/dL (ref 0–40)

## 2019-06-16 LAB — TROPONIN I (HIGH SENSITIVITY): Troponin I (High Sensitivity): 3 ng/L (ref <18)

## 2019-06-16 LAB — MAGNESIUM: Magnesium: 2 mg/dL (ref 1.7–2.4)

## 2019-06-16 LAB — RPR: RPR Ser Ql: NONREACTIVE

## 2019-06-16 MED ORDER — POTASSIUM CHLORIDE CRYS ER 20 MEQ PO TBCR
30.0000 meq | EXTENDED_RELEASE_TABLET | Freq: Once | ORAL | Status: AC
  Administered 2019-06-16: 06:00:00 30 meq via ORAL
  Filled 2019-06-16: qty 1

## 2019-06-16 MED ORDER — METOPROLOL SUCCINATE ER 25 MG PO TB24
25.0000 mg | ORAL_TABLET | Freq: Every day | ORAL | Status: DC
  Administered 2019-06-16: 25 mg via ORAL
  Filled 2019-06-16: qty 1

## 2019-06-16 MED ORDER — NITROGLYCERIN 0.4 MG SL SUBL
SUBLINGUAL_TABLET | SUBLINGUAL | Status: AC
  Administered 2019-06-16: 0.8 mg
  Filled 2019-06-16: qty 2

## 2019-06-16 MED ORDER — ROSUVASTATIN CALCIUM 20 MG PO TABS
20.0000 mg | ORAL_TABLET | Freq: Every day | ORAL | Status: DC
  Administered 2019-06-16: 20 mg via ORAL
  Filled 2019-06-16: qty 1

## 2019-06-16 MED ORDER — ROSUVASTATIN CALCIUM 20 MG PO TABS: 20.0000 mg | ORAL_TABLET | Freq: Every day | ORAL | 0 refills | Status: AC

## 2019-06-16 MED ORDER — IOHEXOL 350 MG/ML SOLN
80.0000 mL | Freq: Once | INTRAVENOUS | Status: AC | PRN
  Administered 2019-06-16: 80 mL via INTRAVENOUS

## 2019-06-16 NOTE — Progress Notes (Signed)
    Work up as below .  No further cardiac work up.  Send home with lipid management given his family history.  No asa.  Can discontinue beta blocker.  Has follow up scheduled.     CT angiogram.   IMPRESSION: 1. Coronary calcium score of 0.  2. Normal coronary origin with left dominance.  3. Normal coronary arteries.   Echo  1. Left ventricular ejection fraction, by estimation, is 60 to 65%. The  left ventricle has normal function. The left ventrical has no regional  wall motion abnormalities. Left ventricular diastolic parameters were  normal.  2. Right ventricular systolic function is normal. The right ventricular  size is normal. There is normal pulmonary artery systolic pressure.  3. Trivial mitral valve regurgitation.  4. The aortic valve is tricuspid. Aortic valve regurgitation is not  visualized. Mild aortic valve sclerosis is present, with no evidence of  aortic valve stenosis.  5. The inferior vena cava is normal in size with <50% respiratory  variability, suggesting right atrial pressure of 8 mmHg.

## 2019-06-16 NOTE — Progress Notes (Signed)
  Echocardiogram 2D Echocardiogram was attempted but the patient was in CT. We will try again later.  Tye Savoy 06/16/2019, 11:40 AM

## 2019-06-16 NOTE — Progress Notes (Signed)
  Echocardiogram 2D Echocardiogram has been performed.  Justin Chandler 06/16/2019, 2:27 PM

## 2019-06-16 NOTE — Progress Notes (Addendum)
[facility] transport from [facility] to [facility] CT 1 made for 06/16/19 at 1200 noon.  Communicated with Morrie Sheldon, RN who is aware patient needs 18g IV and NPO 4 hr prior to scan.  Current HR 55 sb  Please call [facility] Alexandria with questions. Rockwell Alexandria RN Navigator Cardiac Imaging [facility] [phone] Office  [phone] Cell

## 2019-06-16 NOTE — Progress Notes (Deleted)
CT coronary completed.   Pt awaiting echocardiogram prior to discharge.   Please discharge on: 81 mg ASA daily 25 mg toprol at night 20 mg crestor daily  I will arrange cardiology follow up.   [name], PA-C 06/16/2019, 12:24 PM [phone] [facility] [address] [place], [place] [address]  I reviewed the results of the CT with Dr. Delton See.  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  FFR is pending.  This is not the cause of his pain.  However, he needs aggressive primary risk reduction.  He needs meds as above.  He also had LVH and we need the echo before discharge.  Needs to stop smoking.  We will arrange follow up.  Justin Chandler  12:26 PM  06/16/2019

## 2019-06-16 NOTE — Discharge Instructions (Addendum)
Preventing High Cholesterol Cholesterol is a white, waxy substance similar to fat that the human body needs to help build cells. The liver makes all the cholesterol that a person's body needs. Having high cholesterol (hypercholesterolemia) increases a person's risk for heart disease and stroke. Extra (excess) cholesterol comes from the food the person eats. High cholesterol can often be prevented with diet and lifestyle changes. If you already have high cholesterol, you can control it with diet and lifestyle changes and with medicine. How can high cholesterol affect me? If you have high cholesterol, deposits (plaques) may build up on the walls of your arteries. The arteries are the blood vessels that carry blood away from your heart. Plaques make the arteries narrower and stiffer. This can limit or block blood flow and cause blood clots to form. Blood clots:  Are tiny balls of cells that form in your blood.  Can move to the heart or brain, causing a heart attack or stroke. Plaques in arteries greatly increase your risk for heart attack and stroke.Making diet and lifestyle changes can reduce your risk for these conditions that may threaten your life. What can increase my risk? This condition is more likely to develop in people who:  Eat foods that are high in saturated fat or cholesterol. Saturated fat is mostly found in: ? Foods that contain animal fat, such as red meat and some dairy products. ? Certain fatty foods made from plants, such as tropical oils.  Are overweight.  Are not getting enough exercise.  Have a family history of high cholesterol. What actions can I take to prevent this? Nutrition   Eat less saturated fat.  Avoid trans fats (partially hydrogenated oils). These are often found in margarine and in some baked goods, fried foods, and snacks bought in packages.  Avoid precooked or cured meat, such as sausages or meat loaves.  Avoid foods and drinks that have added  sugars.  Eat more fruits, vegetables, and whole grains.  Choose healthy sources of protein, such as fish, poultry, lean cuts of red meat, beans, peas, lentils, and nuts.  Choose healthy sources of fat, such as: ? Nuts. ? Vegetable oils, especially olive oil. ? Fish that have healthy fats (omega-3 fatty acids), such as mackerel or salmon. The items listed above may not be a complete list of recommended foods and beverages. Contact a dietitian for more information. Lifestyle  Lose weight if you are overweight. Losing 5-10 lb (2.3-4.5 kg) can help prevent or control high cholesterol. It can also lower your risk for diabetes and high blood pressure. Ask your health care provider to help you with a diet and exercise plan to lose weight safely.  Do not use any products that contain nicotine or tobacco, such as cigarettes, e-cigarettes, and chewing tobacco. If you need help quitting, ask your health care provider.  Limit your alcohol intake. ? Do not drink alcohol if:  Your health care provider tells you not to drink.  You are pregnant, may be pregnant, or are planning to become pregnant. ? If you drink alcohol:  Limit how much you use to:  0-1 drink a day for women.  0-2 drinks a day for men.  Be aware of how much alcohol is in your drink. In the [place] one drink equals one 12 oz bottle of beer (355 mL), one 5 oz glass of wine (148 mL), or one 1 oz glass of hard liquor (44 mL). Activity   Get enough exercise. Each week, do at   least 150 minutes of exercise that takes a medium level of effort (moderate-intensity exercise). ? This is exercise that:  Makes your heart beat faster and makes you breathe harder than usual.  Allows you to still be able to talk. ? You could exercise in short sessions several times a day or longer sessions a few times a week. For example, on 5 days each week, you could walk fast or ride your bike 3 times a day for 10 minutes each time.  Do exercises as told  by your health care provider. Medicines  In addition to diet and lifestyle changes, your health care provider may recommend medicines to help lower cholesterol. This may be a medicine to lower the amount of cholesterol your liver makes. You may need medicine if: ? Diet and lifestyle changes do not lower your cholesterol enough. ? You have high cholesterol and other risk factors for heart disease or stroke.  Take over-the-counter and prescription medicines only as told by your health care provider. General information  Manage your risk factors for high cholesterol. Talk with your health care provider about all your risk factors and how to lower your risk.  Manage other conditions that you have, such as diabetes or high blood pressure (hypertension).  Have blood tests to check your cholesterol levels at regular points in time as told by your health care provider.  Keep all follow-up visits as told by your health care provider. This is important. Where to find more information  American Heart Association: www.heart.org  National Heart, Lung, and Blood Institute: www.nhlbi.nih.gov Summary  High cholesterol increases your risk for heart disease and stroke. By keeping your cholesterol level low, you can reduce your risk for these conditions.  High cholesterol can often be prevented with diet and lifestyle changes.  Work with your health care provider to manage your risk factors, and have your blood tested regularly. This information is not intended to replace advice given to you by your health care provider. Make sure you discuss any questions you have with your health care provider. Document Revised: 08/15/2018 Document Reviewed: 12/31/2015 Elsevier Patient Education  2020 [employer] High Cholesterol  High cholesterol is a condition in which the blood has high levels of a white, waxy, fat-like substance (cholesterol). The human body needs small amounts of cholesterol. The liver makes  all the cholesterol that the body needs. Extra (excess) cholesterol comes from the food that we eat. Cholesterol is carried from the liver by the blood through the blood vessels. If you have high cholesterol, deposits (plaques) may build up on the walls of your blood vessels (arteries). Plaques make the arteries narrower and stiffer. Cholesterol plaques increase your risk for heart attack and stroke. Work with your health care provider to keep your cholesterol levels in a healthy range. What increases the risk? This condition is more likely to develop in people who:  Eat foods that are high in animal fat (saturated fat) or cholesterol.  Are overweight.  Are not getting enough exercise.  Have a family history of high cholesterol. What are the signs or symptoms? There are no symptoms of this condition. How is this diagnosed? This condition may be diagnosed from the results of a blood test.  If you are older than age 20, your health care provider may check your cholesterol every 4-6 years.  You may be checked more often if you already have high cholesterol or other risk factors for heart disease. The blood test for cholesterol measures:  "  Bad" cholesterol (LDL cholesterol). This is the main type of cholesterol that causes heart disease. The desired level for LDL is less than 100.  "Good" cholesterol (HDL cholesterol). This type helps to protect against heart disease by cleaning the arteries and carrying the LDL away. The desired level for HDL is 60 or higher.  Triglycerides. These are fats that the body can store or burn for energy. The desired number for triglycerides is lower than 150.  Total cholesterol. This is a measure of the total amount of cholesterol in your blood, including LDL cholesterol, HDL cholesterol, and triglycerides. A healthy number is less than 200. How is this treated? This condition is treated with diet changes, lifestyle changes, and medicines. Diet changes  This  may include eating more whole grains, fruits, vegetables, nuts, and fish.  This may also include cutting back on red meat and foods that have a lot of added sugar. Lifestyle changes  Changes may include getting at least 40 minutes of aerobic exercise 3 times a week. Aerobic exercises include walking, biking, and swimming. Aerobic exercise along with a healthy diet can help you maintain a healthy weight.  Changes may also include quitting smoking. Medicines  Medicines are usually given if diet and lifestyle changes have failed to reduce your cholesterol to healthy levels.  Your health care provider may prescribe a statin medicine. Statin medicines have been shown to reduce cholesterol, which can reduce the risk of heart disease. Follow these instructions at home: Eating and drinking If told by your health care provider:  Eat chicken (without skin), fish, veal, shellfish, ground [place] breast, and round or loin cuts of red meat.  Do not eat fried foods or fatty meats, such as hot dogs and salami.  Eat plenty of fruits, such as apples.  Eat plenty of vegetables, such as broccoli, potatoes, and carrots.  Eat beans, peas, and lentils.  Eat grains such as barley, rice, couscous, and bulgur wheat.  Eat pasta without cream sauces.  Use skim or nonfat milk, and eat low-fat or nonfat yogurt and cheeses.  Do not eat or drink whole milk, cream, ice cream, egg yolks, or hard cheeses.  Do not eat stick margarine or tub margarines that contain trans fats (also called partially hydrogenated oils).  Do not eat saturated tropical oils, such as coconut oil and palm oil.  Do not eat cakes, cookies, crackers, or other baked goods that contain trans fats.  General instructions  Exercise as directed by your health care provider. Increase your activity level with activities such as gardening, walking, and taking the stairs.  Take over-the-counter and prescription medicines only as told by your  health care provider.  Do not use any products that contain nicotine or tobacco, such as cigarettes and e-cigarettes. If you need help quitting, ask your health care provider.  Keep all follow-up visits as told by your health care provider. This is important. Contact a health care provider if:  You are struggling to maintain a healthy diet or weight.  You need help to start on an exercise program.  You need help to stop smoking. Get help right away if:  You have chest pain.  You have trouble breathing. This information is not intended to replace advice given to you by your health care provider. Make sure you discuss any questions you have with your health care provider. Document Revised: 04/26/2017 Document Reviewed: 10/22/2015 Elsevier Patient Education  [place]. Nonspecific Chest Pain Chest pain can be caused by  many different conditions. Some causes of chest pain can be life-threatening. These will require treatment right away. Serious causes of chest pain include:  Heart attack.  A tear in the body's main blood vessel.  Redness and swelling (inflammation) around your heart.  Blood clot in your lungs. Other causes of chest pain may not be so serious. These include:  Heartburn.  Anxiety or stress.  Damage to bones or muscles in your chest.  Lung infections. Chest pain can feel like:  Pain or discomfort in your chest.  Crushing, pressure, aching, or squeezing pain.  Burning or tingling.  Dull or sharp pain that is worse when you move, cough, or take a deep breath.  Pain or discomfort that is also felt in your back, neck, jaw, shoulder, or arm, or pain that spreads to any of these areas. It is hard to know whether your pain is caused by something that is serious or something that is not so serious. So it is important to see your doctor right away if you have chest pain. Follow these instructions at home: Medicines  Take over-the-counter and  prescription medicines only as told by your doctor.  If you were prescribed an antibiotic medicine, take it as told by your doctor. Do not stop taking the antibiotic even if you start to feel better. Lifestyle   Rest as told by your doctor.  Do not use any products that contain nicotine or tobacco, such as cigarettes, e-cigarettes, and chewing tobacco. If you need help quitting, ask your doctor.  Do not drink alcohol.  Make lifestyle changes as told by your doctor. These may include: ? Getting regular exercise. Ask your doctor what activities are safe for you. ? Eating a heart-healthy diet. A diet and nutrition specialist (dietitian) can help you to learn healthy eating options. ? Staying at a healthy weight. ? Treating diabetes or high blood pressure, if needed. ? Lowering your stress. Activities such as yoga and relaxation techniques can help. General instructions  Pay attention to any changes in your symptoms. Tell your doctor about them or any new symptoms.  Avoid any activities that cause chest pain.  Keep all follow-up visits as told by your doctor. This is important. You may need more testing if your chest pain does not go away. Contact a doctor if:  Your chest pain does not go away.  You feel depressed.  You have a fever. Get help right away if:  Your chest pain is worse.  You have a cough that gets worse, or you cough up blood.  You have very bad (severe) pain in your belly (abdomen).  You pass out (faint).  You have either of these for no clear reason: ? Sudden chest discomfort. ? Sudden discomfort in your arms, back, neck, or jaw.  You have shortness of breath at any time.  You suddenly start to sweat, or your skin gets clammy.  You feel sick to your stomach (nauseous).  You throw up (vomit).  You suddenly feel lightheaded or dizzy.  You feel very weak or tired.  Your heart starts to beat fast, or it feels like it is skipping beats. These symptoms  may be an emergency. Do not wait to see if the symptoms will go away. Get medical help right away. Call your local emergency services (911 in the [place] Do not drive yourself to the hospital. Summary  Chest pain can be caused by many different conditions. The cause may be serious and need treatment right  away. If you have chest pain, see your doctor right away.  Follow your doctor's instructions for taking medicines and making lifestyle changes.  Keep all follow-up visits as told by your doctor. This includes visits for any further testing if your chest pain does not go away.  Be sure to know the signs that show that your condition has become worse. Get help right away if you have these symptoms. This information is not intended to replace advice given to you by your health care provider. Make sure you discuss any questions you have with your health care provider. Document Revised: 10/24/2017 Document Reviewed: 10/24/2017 Elsevier Patient Education  2020 [employer].

## 2019-06-16 NOTE — H&P (Signed)
History and Physical    Justin Chandler PPJ:[id number] DOB: 1985/05/13 DOA: 06/15/2019  PCP: Patient, No Pcp Per    Patient coming from: Home    Chief Complaint: Chest pain  HPI: Justin Chandler is a 34 y.o. 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 She developed chest pain at work more like a pressure radiating to his neck.  He has intermittent chest discomfort over the past 2 years but today's episode was worse. Patient states his father under age 50 had  heart  attacks and stents He denies any shortness of breath or peripheral edema.  No fever chills Patient also notes that as an aside he has a rash to his right hip area which was benign there intermittently over the past 3 months.  He denies fever or rapid progression.  No drainage from the area.  It is somewhat tender with touching the area  ED Course:  At the time of my examination in no distress Electrocardiogram sinus tachycardia T inversion diffuse more likely cardiomyopathy ED note Spoke with Dr. Percival Spanish with cardiology.  He has reviewed the EKG along with history and vital signs.  I have added a urine drug screen.  Given the patient's family history and story today he states that it would not be unreasonable to observe the patient in the hospital and obtain a coronary artery CT in the morning.  [facility] at [facility].   Review of Systems: As per HPI otherwise 10 point review of systems negative.  With exception chest pain  History reviewed. No pertinent past medical history.  History reviewed. No pertinent surgical history.   reports that he has been smoking cigarettes. He has been smoking about 1.00 pack per day. He has never used smokeless tobacco. He reports that he does not drink alcohol or use drugs.  Allergies  Allergen Reactions  . Erythromycin Anaphylaxis    Family History  Problem Relation Age of Onset  . Diabetes  Father   . Hypertension Father   . Alzheimer's disease Other      Prior to Admission medications   Medication Sig Start Date End Date Taking? Authorizing Provider  ibuprofen (ADVIL) 800 MG tablet Take 1 tablet (800 mg total) by mouth 3 (three) times daily. Patient not taking: Reported on 06/15/2019 01/27/19   Evalee Jefferson, PA-C    Physical Exam: Vitals:   06/15/19 1800 06/15/19 1830 06/15/19 2000 06/15/19 2207  BP: (!) 142/78 (!) 144/80 137/81 121/71  Pulse: 64 63 65 (!) 56  Resp: 20 16 (!) 24 18  Temp:    97.9 F (36.6 C)  TempSrc:    Oral  SpO2: 98% 98% 97% 96%  Weight:      Height:        Constitutional: NAD, calm, comfortable Vitals:   06/15/19 1800 06/15/19 1830 06/15/19 2000 06/15/19 2207  BP: (!) 142/78 (!) 144/80 137/81 121/71  Pulse: 64 63 65 (!) 56  Resp: 20 16 (!) 24 18  Temp:    97.9 F (36.6 C)  TempSrc:    Oral  SpO2: 98% 98% 97% 96%  Weight:      Height:       Eyes: PERRL, lids and conjunctivae normal ENMT: Mucous membranes are moist. Posterior pharynx clear of any exudate or lesions.Normal dentition.  Neck: normal, supple, no masses, no thyromegaly Respiratory: clear to auscultation bilaterally, no wheezing, no crackles. Normal  respiratory effort. No accessory muscle use.  Cardiovascular: Regular rate and rhythm, no murmurs / rubs / gallops. No extremity edema. 2+ pedal pulses. No carotid bruits.  Abdomen: no tenderness, no masses palpated. No hepatosplenomegaly. Bowel sounds positive.  Musculoskeletal: no clubbing / cyanosis. No joint deformity upper and lower extremities. Good ROM, no contractures. Normal muscle tone.  Skin: no rashes, lesions, ulcers. No induration Neurologic: CN 2-12 grossly intact. Sensation intact, DTR normal. Strength 5/5 in all 4.  Psychiatric: Normal judgment and insight. Alert and oriented x 3. Normal mood.    Labs on Admission: I have personally reviewed following labs and imaging studies  CBC: Recent Labs  Lab  06/15/19 1430  WBC 9.6  HGB 15.3  HCT 43.9  MCV 94.8  PLT 281   Basic Metabolic Panel: Recent Labs  Lab 06/15/19 1430  NA 139  K 3.3*  CL 102  CO2 27  GLUCOSE 96  BUN 16  CREATININE 1.16  CALCIUM 9.2   GFR: Estimated Creatinine Clearance: 107.9 mL/min (by C-G formula based on SCr of 1.16 mg/dL). Liver Function Tests: No results for input(s): AST, ALT, ALKPHOS, BILITOT, PROT, ALBUMIN in the last 168 hours. No results for input(s): LIPASE, AMYLASE in the last 168 hours. No results for input(s): AMMONIA in the last 168 hours. Coagulation Profile: No results for input(s): INR, PROTIME in the last 168 hours. Cardiac Enzymes: No results for input(s): CKTOTAL, CKMB, CKMBINDEX, TROPONINI in the last 168 hours. BNP (last 3 results) No results for input(s): PROBNP in the last 8760 hours. HbA1C: No results for input(s): HGBA1C in the last 72 hours. CBG: No results for input(s): GLUCAP in the last 168 hours. Lipid Profile: No results for input(s): CHOL, HDL, LDLCALC, TRIG, CHOLHDL, LDLDIRECT in the last 72 hours. Thyroid Function Tests: No results for input(s): TSH, T4TOTAL, FREET4, T3FREE, THYROIDAB in the last 72 hours. Anemia Panel: No results for input(s): VITAMINB12, FOLATE, FERRITIN, TIBC, IRON, RETICCTPCT in the last 72 hours. Urine analysis:    Component Value Date/Time   COLORURINE COLORLESS (A) 10/14/2014 0047   APPEARANCEUR CLEAR (A) 10/14/2014 0047   APPEARANCEUR Clear 03/24/2014 1929   LABSPEC 1.005 10/14/2014 0047   LABSPEC 1.005 03/24/2014 1929   PHURINE 7.0 10/14/2014 0047   GLUCOSEU NEGATIVE 10/14/2014 0047   GLUCOSEU 50 mg/dL 73/71/0626 9485   HGBUR NEGATIVE 10/14/2014 0047   BILIRUBINUR NEGATIVE 10/14/2014 0047   BILIRUBINUR Negative 03/24/2014 1929   KETONESUR NEGATIVE 10/14/2014 0047   PROTEINUR NEGATIVE 10/14/2014 0047   UROBILINOGEN 0.2 11/25/2011 1314   NITRITE NEGATIVE 10/14/2014 0047   LEUKOCYTESUR NEGATIVE 10/14/2014 0047   LEUKOCYTESUR  Negative 03/24/2014 1929    Radiological Exams on Admission: DG Chest 2 View  Result Date: 06/15/2019 CLINICAL DATA:  Chest pain EXAM: CHEST - 2 VIEW COMPARISON:  May 30, 2015 FINDINGS: Lungs are clear. Heart size and pulmonary vascularity are normal. No adenopathy. No pneumothorax. No bone lesions. IMPRESSION: No abnormality noted. Electronically Signed   By: Bretta Bang III M.D.   On: 06/15/2019 15:02    EKG: Independently reviewed.  Sinus tachycardia diffuse T wave inversion more likely cardiomyopathy  Assessment and plan  Chest pain Atypical Abnormal electrocardiogram diffuse T inversion different electrocardiogram in 2016 Troponin negative x2 Emergency room discussed the case with cardiology Admit observation, trade troponin, CT angio coronary, aspirin    Assessment/Plan Active Problems:   Chest pain      DVT prophylaxis: Lovenox Code Status: Full code Family Communication: None Disposition Plan: Home Consults  called: Cardiology by emergency room Admission status: Observation   Louna Rothgeb G Ziare Cryder MD [facility]  If 7PM-7AM, please contact night-coverage [email or web address]   06/16/2019, 2:53 AM

## 2019-06-16 NOTE — Discharge Summary (Signed)
Discharge Summary  Justin MaresJohn W Chandler JWJ:[id number]:[id number] DOB: 1986/04/27  PCP: Patient, No Pcp Per  Admit date: 06/15/2019 Discharge date: 06/16/2019  Time spent: 35 minutes  Recommendations for Outpatient Follow-up:  1. Follow-up with cardiology. 2. Follow-up with your PCP 3. Take your medications as prescribed.  Discharge Diagnoses:  Active Hospital Problems   Diagnosis Date Noted  . Precordial chest pain 06/15/2019    Resolved Hospital Problems  No resolved problems to display.    Discharge Condition: Stable  Diet recommendation: Heart healthy diet.  Vitals:   06/16/19 0527 06/16/19 1253  BP: 107/68 133/88  Pulse: (!) 57 (!) 58  Resp: 18 20  Temp: 98.4 F (36.9 C) 98.2 F (36.8 C)  SpO2: 98% 100%    History of present illness:  Justin Chandler is a 34 y.o. 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 She developed chest pain at work more like a pressure radiating to his neck.  He has intermittent chest discomfort over the past 2 years but today's episode was worse. Patient states his father under age 34 had  heart  attacks and stents He denies any shortness of breath or peripheral edema.  No fever chills Patient also notes that as an aside he has a rash to his right hip area which was benign there intermittently over the past 3 months. He denies fever or rapid progression. No drainage from the area. It is somewhat tender with touching the area  ED Course:  At the time of my examination in no distress Electrocardiogram sinus tachycardia T inversion diffuse more likely cardiomyopathy ED note Spoke with Dr. Antoine PocheHochrein with cardiology. He has reviewed the EKG along with history and vital signs. I have added a urine drug screen.Given the patient's family history and story today he states that it would not be unreasonable to observe the patient in the hospital and obtain a coronary artery CT in  the morning. [facility]  06/16/19: Seen and examined.  Denies chest pain at the time of this exam.  Seen by cardiology.  Work-up and recommendation from cardiology Dr. Antoine PocheHochrein as followed:  Work up as below .  No further cardiac work up.  Send home with lipid management given his family history.  No asa.  Can discontinue beta blocker.  Has follow up scheduled.     CT angiogram.   IMPRESSION: 1. Coronary calcium score of 0.  2. Normal coronary origin with left dominance.  3. Normal coronary arteries.   Echo  1. Left ventricular ejection fraction, by estimation, is 60 to 65%. The  left ventricle has normal function. The left ventrical has no regional  wall motion abnormalities. Left ventricular diastolic parameters were  normal.  2. Right ventricular systolic function is normal. The right ventricular  size is normal. There is normal pulmonary artery systolic pressure.  3. Trivial mitral valve regurgitation.  4. The aortic valve is tricuspid. Aortic valve regurgitation is not  visualized. Mild aortic valve sclerosis is present, with no evidence of  aortic valve stenosis.  5. The inferior vena cava is normal in size with <50% respiratory  variability, suggesting right atrial pressure of 8 mmHg.              Hospital Course:  Active Problems:   Precordial chest pain  Chest pain rule out ACS Troponin is negative x3 Positive THC Seen by cardiology Findings as  stated above Follow-up with cardiology Start Crestor  Hyperlipidemia LDL 129 on 06/16/2019. Start Crestor Follow-up with your PCP     Procedures:  CT coronary  Consultations:  Cardiology  Discharge Exam: BP 133/88 (BP Location: Left Arm)   Pulse (!) 58   Temp 98.2 F (36.8 C) (Oral)   Resp 20   Ht 5\' 11"  (1.803 m)   Wt 97.5 kg   SpO2 100%   BMI 29.99 kg/m  . General: 34 y.o. year-old male well developed well nourished in no acute distress.  Alert and  oriented x3. . Cardiovascular: Regular rate and rhythm with no rubs or gallops.  No thyromegaly or JVD noted.   Marland Kitchen Respiratory: Clear to auscultation with no wheezes or rales. Good inspiratory effort. . Abdomen: Soft nontender nondistended with normal bowel sounds x4 quadrants. . Musculoskeletal: No lower extremity edema. 2/4 pulses in all 4 extremities. . Skin: No ulcerative lesions noted or rashes, . Psychiatry: Mood is appropriate for condition and setting  Discharge Instructions You were cared for by a hospitalist during your hospital stay. If you have any questions about your discharge medications or the care you received while you were in the hospital after you are discharged, you can call the unit and asked to speak with the hospitalist on call if the hospitalist that took care of you is not available. Once you are discharged, your primary care physician will handle any further medical issues. Please note that NO REFILLS for any discharge medications will be authorized once you are discharged, as it is imperative that you return to your primary care physician (or establish a relationship with a primary care physician if you do not have one) for your aftercare needs so that they can reassess your need for medications and monitor your lab values.   Allergies as of 06/16/2019      Reactions   Erythromycin Anaphylaxis      Medication List    STOP taking these medications   ibuprofen 800 MG tablet Commonly known as: ADVIL     TAKE these medications   rosuvastatin 20 MG tablet Commonly known as: CRESTOR Take 1 tablet (20 mg total) by mouth daily at 6 PM. Start taking on: June 17, 2019      Allergies  Allergen Reactions  . Erythromycin Anaphylaxis   Follow-up Information    [name], PA Follow up on 07/01/2019.   Specialties: Physician Assistant, Cardiology, Radiology Why: 3:45 pm for hospital follow up Contact information: [address] [place]  [place] [address] [phone]        Rollene Rotunda, MD. Call in 1 day(s).   Specialty: Cardiology Why: Please call for a post hospital follow-up appointment. Contact information: [address] [place] [address] [phone]        [facility]. Call in 1 day(s).   Why: Please call for a post hospital follow-up appointment. Contact information: [address] [place] [address] [phone]           The results of significant diagnostics from this hospitalization (including imaging, microbiology, ancillary and laboratory) are listed below for reference.    Significant Diagnostic Studies: DG Chest 2 View  Result Date: 06/15/2019 CLINICAL DATA:  Chest pain EXAM: CHEST - 2 VIEW COMPARISON:  May 30, 2015 FINDINGS: Lungs are clear. Heart size and pulmonary vascularity are normal. No adenopathy. No pneumothorax. No bone lesions. IMPRESSION: No abnormality noted. Electronically Signed   By:  Bretta Bang III M.D.   On: 06/15/2019 15:02   CT CORONARY MORPH W/CTA COR W/SCORE W/CA W/CM &/OR WO/CM  Addendum Date: 06/16/2019   ADDENDUM REPORT: 06/16/2019 14:43 CLINICAL DATA:  Chest pain EXAM: Cardiac/Coronary CTA TECHNIQUE: The patient was scanned on a [employer]. A 100 kV prospective scan was triggered in the descending thoracic aorta at 111 HU's. Axial non-contrast 3 mm slices were carried out through the heart. The data set was analyzed on a dedicated work station and scored using the Agatson method. Gantry rotation speed was 250 msecs and collimation was .6 mm. No beta blockade and 0.8 mg of sl NTG was given. The 3D data set was reconstructed in 5% intervals of the 35-75 % of the R-R cycle. Diastolic phases were analyzed on a dedicated work station using MPR, MIP and VRT modes. The patient received 80 cc of contrast. FINDINGS: Image quality: Excellent. Noise artifact is: Limited. Coronary Arteries:  Normal  coronary origin. Left dominance. Left main: The left main is a short, large caliber vessel with a normal take off from the left coronary cusp that bifurcates to form a left anterior descending artery and a left circumflex artery. There is no plaque or stenosis. Left anterior descending artery: The LAD is patent without evidence of plaque or stenosis. The LAD gives off 2 patent diagonal branches. Left circumflex artery: The LCX is dominant and patent with no evidence of plaque or stenosis. The LCX gives off 2 patent obtuse marginal branches. The LCX terminates as a PDA without evidence of plaque or stenosis Right coronary artery: The RCA is non-dominant with normal take off from the right coronary cusp. There is no evidence of plaque or stenosis. Right Atrium: Right atrial size is within normal limits. Right Ventricle: The right ventricular cavity is within normal limits. Left Atrium: Left atrial size is normal in size with no left atrial appendage filling defect. There is a small diverticulum in the inferior/posterior aspect of the IAS. This is benign, incidental finding. Left Ventricle: The ventricular cavity size is within normal limits. There are no stigmata of prior infarction. There is no abnormal filling defect. Pulmonary arteries: Normal in size without proximal filling defect. Pulmonary veins: Normal pulmonary venous drainage. Pericardium: Normal thickness with no significant effusion or calcium present. Cardiac valves: The aortic valve is trileaflet without significant calcification. The mitral valve is normal structure without significant calcification. Aorta: Normal caliber with no significant disease. Extra-cardiac findings: Hiatal hernia noted. See attached radiology report for non-cardiac structures. IMPRESSION: 1. Coronary calcium score of 0. 2. Normal coronary origin with left dominance. 3. Normal coronary arteries. RECOMMENDATIONS: 1. No evidence of CAD (0%). Consider non-atherosclerotic causes of  chest pain. Lennie Odor, MD Electronically Signed   By: Lennie Odor   On: 06/16/2019 14:43   Result Date: 06/16/2019 EXAM: OVER-READ INTERPRETATION  CT CHEST The following report is an over-read performed by radiologist Dr. Richarda Overlie of [facility] on 06/16/2019. This over-read does not include interpretation of cardiac or coronary anatomy or pathology. The coronary calcium score/coronary CTA interpretation by the cardiologist is attached. COMPARISON:  Chest radiograph 06/15/2019 and CT abdomen 07/10/2012 FINDINGS: Vascular: Normal caliber of the visualized thoracic aorta. No significant pericardial fluid. Normal caliber of the main pulmonary arteries. Mediastinum/Nodes: Visualized mediastinal structures are normal limits. Lungs/Pleura: Visualized lungs are clear. Mild dependent atelectasis in the lower lungs. No large pleural effusions. Upper Abdomen: 1.0 cm hypodensity at the hepatic dome is nonspecific. This hypodensity was not  clearly visible on the prior examination. Otherwise, images of upper abdomen are unremarkable. Musculoskeletal: No acute bone abnormality. IMPRESSION: 1. No acute abnormality. 2. Question a 1 cm hypodensity at the hepatic dome. This finding is indeterminate but likely incidental. Electronically Signed: By: Richarda Overlie M.D. On: 06/16/2019 11:44   ECHOCARDIOGRAM COMPLETE  Result Date: 06/16/2019    ECHOCARDIOGRAM REPORT   Patient Name:   Justin Chandler Date of Exam: 06/16/2019 Medical Rec #:  [id number]     Height:       71.0 in Accession #:    [id number]    Weight:       215.0 lb Date of Birth:  01-29-1986     BSA:          2.17 m Patient Age:    33 years      BP:           107/68 mmHg Patient Gender: M             HR:           58 bpm. Exam Location:  Inpatient Procedure: 2D Echo Indications:    Chest Pain R07.9  History:        Patient has no prior history of Echocardiogram examinations.                 Risk Factors:Current Smoker.  Sonographer:    Thurman Coyer RDCS  (AE) Referring Phys: [id number] [facility]STES[facility] IMPRESSIONS  1. Left ventricular ejection fraction, by estimation, is 60 to 65%. The left ventricle has normal function. The left ventrical has no regional wall motion abnormalities. Left ventricular diastolic parameters were normal.  2. Right ventricular systolic function is normal. The right ventricular size is normal. There is normal pulmonary artery systolic pressure.  3. Trivial mitral valve regurgitation.  4. The aortic valve is tricuspid. Aortic valve regurgitation is not visualized. Mild aortic valve sclerosis is present, with no evidence of aortic valve stenosis.  5. The inferior vena cava is normal in size with <50% respiratory variability, suggesting right atrial pressure of 8 mmHg. FINDINGS  Left Ventricle: Left ventricular ejection fraction, by estimation, is 60 to 65%. The left ventricle has normal function. The left ventricle has no regional wall motion abnormalities. The left ventricular internal cavity size was normal in size. There is  no left ventricular hypertrophy. Left ventricular diastolic parameters were normal. Right Ventricle: The right ventricular size is normal. No increase in right ventricular wall thickness. Right ventricular systolic function is normal. There is normal pulmonary artery systolic pressure. The tricuspid regurgitant velocity is 1.75 m/s, and  with an assumed right atrial pressure of 8 mmHg, the estimated right ventricular systolic pressure is 20.2 mmHg. Left Atrium: Left atrial size was normal in size. Right Atrium: Right atrial size was normal in size. Pericardium: There is no evidence of pericardial effusion. Mitral Valve: The mitral valve is normal in structure and function. Normal mobility of the mitral valve leaflets. Trivial mitral valve regurgitation. No evidence of mitral valve stenosis. Tricuspid Valve: The tricuspid valve is normal in structure. Tricuspid valve regurgitation is trivial. No evidence of tricuspid  stenosis. Aortic Valve: The aortic valve is tricuspid. Aortic valve regurgitation is not visualized. Mild aortic valve sclerosis is present, with no evidence of aortic valve stenosis. Pulmonic Valve: The pulmonic valve was normal in structure. Pulmonic valve regurgitation is not visualized. No evidence of pulmonic stenosis. Aorta: The aortic root is normal in size and structure. Venous: The inferior  vena cava is normal in size with less than 50% respiratory variability, suggesting right atrial pressure of 8 mmHg. The inferior vena cava and the hepatic vein show a normal flow pattern. IAS/Shunts: No atrial level shunt detected by color flow Doppler.  LEFT VENTRICLE PLAX 2D LVIDd:         5.10 cm  Diastology LVIDs:         3.60 cm  LV e' lateral:   13.20 cm/s LV PW:         0.90 cm  LV E/e' lateral: 7.3 LV IVS:        0.80 cm  LV e' medial:    10.60 cm/s LVOT diam:     2.20 cm  LV E/e' medial:  9.1 LV SV:         66.90 ml LV SV Index:   31.06 LVOT Area:     3.80 cm  RIGHT VENTRICLE RV S prime:     11.60 cm/s TAPSE (M-mode): 1.9 cm LEFT ATRIUM             Index       RIGHT ATRIUM           Index LA diam:        3.30 cm 1.52 cm/m  RA Area:     14.20 cm LA Vol (A2C):   28.8 ml 13.25 ml/m RA Volume:   31.30 ml  14.39 ml/m LA Vol (A4C):   22.4 ml 10.30 ml/m LA Biplane Vol: 27.5 ml 12.65 ml/m  AORTIC VALVE LVOT Vmax:   81.70 cm/s LVOT Vmean:  50.400 cm/s LVOT VTI:    0.176 m  AORTA Ao Root diam: 3.10 cm MITRAL VALVE                        TRICUSPID VALVE MV Area (PHT): 3.27 cm             TR Peak grad:   12.2 mmHg MV Decel Time: 232 msec             TR Vmax:        175.00 cm/s MV E velocity: 96.80 cm/s 103 cm/s MV A velocity: 61.30 cm/s 70.3 cm/s SHUNTS MV E/A ratio:  1.58       1.5       Systemic VTI:  0.18 m                                     Systemic Diam: 2.20 cm Fransico Him MD Electronically signed by Fransico Him MD Signature Date/Time: 06/16/2019/2:37:51 PM    Final     Microbiology: Recent Results (from  the past 240 hour(s))  Respiratory Panel by RT PCR (Flu A&B, Covid) - Nasopharyngeal Swab     Status: None   Collection Time: 06/15/19  6:31 PM   Specimen: Nasopharyngeal Swab  Result Value Ref Range Status   SARS Coronavirus 2 by RT PCR NEGATIVE NEGATIVE Final    Comment: (NOTE) SARS-CoV-2 target nucleic acids are NOT DETECTED. The SARS-CoV-2 RNA is generally detectable in upper respiratoy specimens during the acute phase of infection. The lowest concentration of SARS-CoV-2 viral copies this assay can detect is 131 copies/mL. A negative result does not preclude SARS-Cov-2 infection and should not be used as the sole basis for treatment or other patient management decisions. A negative result may occur with  improper specimen  collection/handling, submission of specimen other than nasopharyngeal swab, presence of viral mutation(s) within the areas targeted by this assay, and inadequate number of viral copies (<131 copies/mL). A negative result must be combined with clinical observations, patient history, and epidemiological information. The expected result is Negative. Fact Sheet for Patients:  https://www.moore.com/ Fact Sheet for Healthcare Providers:  https://www.young.biz/ This test is not yet ap proved or cleared by the [place] FDA and  has been authorized for detection and/or diagnosis of SARS-CoV-2 by FDA under an Emergency Use Authorization (EUA). This EUA will remain  in effect (meaning this test can be used) for the duration of the COVID-19 declaration under Section 564(b)(1) of the Act, 21 U.S.C. section 360bbb-3(b)(1), unless the authorization is terminated or revoked sooner.    Influenza A by PCR NEGATIVE NEGATIVE Final   Influenza B by PCR NEGATIVE NEGATIVE Final    Comment: (NOTE) The Xpert Xpress SARS-CoV-2/FLU/RSV assay is intended as an aid in  the diagnosis of influenza from Nasopharyngeal swab specimens and  should  not be used as a sole basis for treatment. Nasal washings and  aspirates are unacceptable for Xpert Xpress SARS-CoV-2/FLU/RSV  testing. Fact Sheet for Patients: https://www.moore.com/ Fact Sheet for Healthcare Providers: https://www.young.biz/ This test is not yet approved or cleared by the [place] FDA and  has been authorized for detection and/or diagnosis of SARS-CoV-2 by  FDA under an Emergency Use Authorization (EUA). This EUA will remain  in effect (meaning this test can be used) for the duration of the  Covid-19 declaration under Section 564(b)(1) of the Act, 21  U.S.C. section 360bbb-3(b)(1), unless the authorization is  terminated or revoked. Performed at [facility], [address] [place], [place] [address]      Labs: Basic Metabolic Panel: Recent Labs  Lab 06/15/19 1430 06/16/19 0528  NA 139 138  K 3.3* 3.8  CL 102 104  CO2 27 27  GLUCOSE 96 91  BUN 16 16  CREATININE 1.16 1.13  CALCIUM 9.2 8.4*  MG  --  2.0   Liver Function Tests: No results for input(s): AST, ALT, ALKPHOS, BILITOT, PROT, ALBUMIN in the last 168 hours. No results for input(s): LIPASE, AMYLASE in the last 168 hours. No results for input(s): AMMONIA in the last 168 hours. CBC: Recent Labs  Lab 06/15/19 1430 06/16/19 0528  WBC 9.6 5.4  HGB 15.3 14.0  HCT 43.9 42.1  MCV 94.8 97.2  PLT 281 269   Cardiac Enzymes: No results for input(s): CKTOTAL, CKMB, CKMBINDEX, TROPONINI in the last 168 hours. BNP: BNP (last 3 results) No results for input(s): BNP in the last 8760 hours.  ProBNP (last 3 results) No results for input(s): PROBNP in the last 8760 hours.  CBG: No results for input(s): GLUCAP in the last 168 hours.     Signed:  Darlin Drop, MD [facility] 06/16/2019, 5:07 PM

## 2019-06-16 NOTE — Progress Notes (Signed)
Justin Chandler is a 34 y.o. 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 She developed chest pain at work more like a pressure radiating to his neck.  He has intermittent chest discomfort over the past 2 years but today's episode was worse.  Seen and examined this AM at his bedside.  Reports pain subsided but feels heaviness in his chest.  Cardiology following.  Plan for CT coronary.  Please refer to H&P dictated by my partner for further details of the assessment and plan.

## 2019-06-16 NOTE — Consult Note (Addendum)
Cardiology Consultation:   Patient ID: Justin Chandler MRN: [id number]; DOB: 27-Aug-1985  Admit date: 06/15/2019 Date of Consult: 06/16/2019  Primary Care Provider: Patient, No Pcp Per Primary Cardiologist: Minus Breeding, MD  Primary Electrophysiologist:  None    Patient Profile:   Justin Chandler is a 34 y.o. 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 Criss Alvine.  History of Present Illness:   Justin Chandler reports symptoms concerning for stable angina for the last year. He works for [employer] and states that over the past year, he has experienced exertional chest tightness, relieved with rest. Yesterday, he was working cutting trees and has worsening chest tightness that radiated to his bilateral neck and associated with SOB, diaphoresis, and nausea. He presented to [facility] for [facility] evaluation. EKG on arrival with TWI inferior leads, V3-6 with sinus tachycardia HR 107 - ST changes now resolved.  He received 324 mg ASA, no nitro.  He is generally chest pain free now.   He denies history of MI, stroke, prior cath, and stress testing. His father had heart disease starting in his late 47s. He smokes cigarettes and THC. He has a history of IVDU with cocaine, but has been clean for 2.5 years. 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   Heart Pathway Score:  HEAR Score: 4  History reviewed. No pertinent past medical history.  History reviewed. No pertinent surgical history.   Home Medications:  Prior to Admission medications   Medication Sig Start Date End Date Taking? Authorizing Provider  ibuprofen (ADVIL) 800 MG tablet Take 1 tablet (800 mg total) by mouth 3 (three) times daily. Patient not taking: Reported on 06/15/2019 01/27/19   Evalee Jefferson, PA-C    Inpatient Medications: Scheduled Meds: . aspirin EC  81 mg Oral Daily  . enoxaparin (LOVENOX)  injection  40 mg Subcutaneous Q24H   Continuous Infusions: . sodium chloride 75 mL/hr at 06/15/19 2243   PRN Meds:   Allergies:    Allergies  Allergen Reactions  . Erythromycin Anaphylaxis    Social History:   Social History   Socioeconomic History  . Marital status: Single    Spouse name: Not on file  . Number of children: Not on file  . Years of education: Not on file  . Highest education level: Not on file  Occupational History  . Not on file  Tobacco Use  . Smoking status: Current Some Day Smoker    Packs/day: 1.00    Types: Cigarettes  . Smokeless tobacco: Never Used  Substance and Sexual Activity  . Alcohol use: No  . Drug use: No  . Sexual activity: Yes    Birth control/protection: None  Other Topics Concern  . Not on file  Social History Narrative  . Not on file   Social Determinants of Health   Financial Resource Strain:   . Difficulty of Paying Living Expenses: Not on file  Food Insecurity:   . Worried About Charity fundraiser in the Last Year: Not on file  . Ran Out of Food in the Last Year: Not on file  Transportation Needs:   . Lack of Transportation (Medical): Not on file  . Lack of Transportation (Non-Medical): Not on file  Physical Activity:   . Days of Exercise per Week: Not on file  . Minutes of  Exercise per Session: Not on file  Stress:   . Feeling of Stress : Not on file  Social Connections:   . Frequency of Communication with Friends and Family: Not on file  . Frequency of Social Gatherings with Friends and Family: Not on file  . Attends Religious Services: Not on file  . Active Member of Clubs or Organizations: Not on file  . Attends Banker Meetings: Not on file  . Marital Status: Not on file  Intimate Partner Violence:   . Fear of Current or Ex-Partner: Not on file  . Emotionally Abused: Not on file  . Physically Abused: Not on file  . Sexually Abused: Not on file    Family History:    Family History  Problem  Relation Age of Onset  . Diabetes Father   . Hypertension Father   . Heart disease Father   . Alzheimer's disease Other      ROS:  Please see the history of present illness.   All other ROS reviewed and negative.     Physical Exam/Data:   Vitals:   06/15/19 1830 06/15/19 2000 06/15/19 2207 06/16/19 0527  BP: (!) 144/80 137/81 121/71 107/68  Pulse: 63 65 (!) 56 (!) 57  Resp: 16 (!) 24 18 18   Temp:   97.9 F (36.6 C) 98.4 F (36.9 C)  TempSrc:   Oral Oral  SpO2: 98% 97% 96% 98%  Weight:      Height:        Intake/Output Summary (Last 24 hours) at 06/16/2019 0851 Last data filed at 06/16/2019 0846 Gross per 24 hour  Intake 561.25 ml  Output 760 ml  Net -198.75 ml   Last 3 Weights 06/15/2019 01/27/2019 05/30/2015  Weight (lbs) 215 lb 223 lb 198 lb  Weight (kg) 97.523 kg 101.152 kg 89.812 kg     Body mass index is 29.99 kg/m.  General:  Well nourished, well developed, in no acute distress HEENT: normal Neck: no JVD Vascular: No carotid bruits Cardiac:  normal S1, S2; RRR; no murmur  Lungs:  clear to auscultation bilaterally, no wheezing, rhonchi or rales  Abd: soft, nontender, no hepatomegaly  Ext: no edema Musculoskeletal:  No deformities, BUE and BLE strength normal and equal Skin: warm and dry  Neuro:  CNs 2-12 intact, no focal abnormalities noted Psych:  Normal affect   EKG:  The EKG was personally reviewed and demonstrates:  Sinus tachycardia on arrival with HR 107, TWI inferior leads and V3-6 Telemetry:  Telemetry was personally reviewed and demonstrates:  Sinus to sinus bradycardia in the 50s  Relevant CV Studies:  none  Laboratory Data:  High Sensitivity Troponin:   Recent Labs  Lab 06/15/19 1430 06/15/19 1737 06/15/19 2300  TROPONINIHS 3 3 3      Chemistry Recent Labs  Lab 06/15/19 1430  NA 139  K 3.3*  CL 102  CO2 27  GLUCOSE 96  BUN 16  CREATININE 1.16  CALCIUM 9.2  GFRNONAA >60  GFRAA >60  ANIONGAP 10    No results for input(s):  PROT, ALBUMIN, AST, ALT, ALKPHOS, BILITOT in the last 168 hours. Hematology Recent Labs  Lab 06/15/19 1430 06/16/19 0528  WBC 9.6 5.4  RBC 4.63 4.33  HGB 15.3 14.0  HCT 43.9 42.1  MCV 94.8 97.2  MCH 33.0 32.3  MCHC 34.9 33.3  RDW 12.2 12.5  PLT 281 269   BNPNo results for input(s): BNP, PROBNP in the last 168 hours.  DDimer No results for  input(s): DDIMER in the last 168 hours.   Radiology/Studies:  DG Chest 2 View  Result Date: 06/15/2019 CLINICAL DATA:  Chest pain EXAM: CHEST - 2 VIEW COMPARISON:  May 30, 2015 FINDINGS: Lungs are clear. Heart size and pulmonary vascularity are normal. No adenopathy. No pneumothorax. No bone lesions. IMPRESSION: No abnormality noted. Electronically Signed   By: Bretta Bang III M.D.   On: 06/15/2019 15:02       HEAR Score (for undifferentiated chest pain):  HEAR Score: 4    Assessment and Plan:   1. Chest pain - hs troponin x 3 negative - EKG with downsloping ST and TWI inferior leads and V3-6 with HR 107 - new since 2016, now resolved - UDS positive for THC only - pt pain  - given family history of premature heart disease and pt's risk factors, will obtain coronary CT today - insert 198 gauge in [facility] or higher - HR has been in the 50s, will hold off on PO lopressor - no heparin with negative enzymes   2. Hx of substance abuse - UDS positive for THC - no IVDU for 2.5 years   3. Tobacco use - needs to quit smoking     For questions or updates, please contact [facility] Please consult [email or web address] for contact info under     Signed, [name], PA  06/16/2019 8:51 AM  History and all data above reviewed.  Patient examined.  I agree with the findings as above.   Patient presents with throat and chest discomfort.  This is actually been going on for about a year.  However, it is becoming more frequent and more intense.  It happens with exertion.  He works as a Designer, industrial/product.  He notices his throat closing up.  He  has severe discomfort.  He will get short of breath.  He is already diaphoretic when he is working.  Yesterday he presented to the emergency room and his discomfort was more intense than previous.  By the time he got through the waiting area it had already gone away.  He has not had any without exertion but again it is an accelerated pattern.  He has had no prior cardiac history but he does have a strong family history with his father having early onset heart disease.  He has a tobacco history.  Of note he did have T wave inversions on his EKG that were new compared to previous.  Troponins however were negative.  The patient exam reveals COR:RRR  ,  Lungs: Clear  ,  Abd: Positive bowel sounds, no rebound no guarding, Ext No edema  .  All available labs, radiology testing, previous records reviewed. Agree with documented assessment and plan. CHEST PAIN: At least moderately elevated pretest probability of obstructive coronary disease.  Coronary CTA is indicated.   Dyslipidemia: Goals of therapy will be based on findings on his CT.    Tobacco use: Discussed with the patient and we will continue to educate. Fayrene Fearing Rino Hosea  10:50 AM  06/16/2019

## 2019-06-17 LAB — HEMOGLOBIN A1C
Hgb A1c MFr Bld: 5.4 % (ref 4.8–5.6)
Mean Plasma Glucose: 108 mg/dL

## 2019-06-30 NOTE — Progress Notes (Signed)
Cardiology Office Note:    Date:  07/01/2019   ID:  Brennyn, Haisley Jun 04, 1985, MRN [id number]  PCP:  Patient, No Pcp Per  Cardiologist:  Minus Breeding, MD   Referring MD: No ref. provider found   Chief Complaint  Patient presents with  . Hospitalization Follow-up    chest pain    History of Present Illness:    Justin Chandler is a 34 y.o. 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 He has a strong family history of premature heart disease in his father starting in his 61s.  He was recently seen in consult for chest pain and underwent CT coronary on 06/16/19. CT coronary was normal, calcium score was zero. He as discharged with cardiology follow up.    He presents today for follow up. He continues to have mild atypical chest pain. He has been taking crestor but reports constipation. We discussed switching to lipitor. I expressed the importance of lipid control given his family history. He will stick with the crestor for now. I also discussed the miscommunication concerning his CT scan results. He is thankful for the clarification.    History reviewed. No pertinent past medical history.  History reviewed. No pertinent surgical history.  Current Medications: Current Meds  Medication Sig  . rosuvastatin (CRESTOR) 20 MG tablet Take 1 tablet (20 mg total) by mouth daily at 6 PM.     Allergies:   Erythromycin   Social History   Socioeconomic History  . Marital status: Single    Spouse name: Not on file  . Number of children: Not on file  . Years of education: Not on file  . Highest education level: Not on file  Occupational History  . Not on file  Tobacco Use  . Smoking status: Current Some Day Smoker    Packs/day: 1.00    Types: Cigarettes  . Smokeless tobacco: Never Used  Substance and Sexual Activity  . Alcohol use: No  . Drug use: No  . Sexual activity: Yes    Birth control/protection: None  Other Topics Concern    . Not on file  Social History Narrative  . Not on file   Social Determinants of Health   Financial Resource Strain:   . Difficulty of Paying Living Expenses: Not on file  Food Insecurity:   . Worried About Charity fundraiser in the Last Year: Not on file  . Ran Out of Food in the Last Year: Not on file  Transportation Needs:   . Lack of Transportation (Medical): Not on file  . Lack of Transportation (Non-Medical): Not on file  Physical Activity:   . Days of Exercise per Week: Not on file  . Minutes of Exercise per Session: Not on file  Stress:   . Feeling of Stress : Not on file  Social Connections:   . Frequency of Communication with Friends and Family: Not on file  . Frequency of Social Gatherings with Friends and Family: Not on file  . Attends Religious Services: Not on file  . Active Member of Clubs or Organizations: Not on file  . Attends Archivist Meetings: Not on file  . Marital Status: Not on file     Family History: The patient's family history includes Alzheimer's disease in an other family member; Diabetes in his father; Heart disease in his father; Hypertension in his father.  ROS:   Please see the  history of present illness.     All other systems reviewed and are negative.  EKGs/Labs/Other Studies Reviewed:    The following studies were reviewed today:  CT coronary 06/16/19: IMPRESSION: 1. Coronary calcium score of 0.  2. Normal coronary origin with left dominance.  3. Normal coronary arteries.  RECOMMENDATIONS: 1. No evidence of CAD (0%). Consider non-atherosclerotic causes of chest pain.    EKG:  EKG is ordered today.  The ekg ordered today demonstrates sinus rhythm with HR 89  Recent Labs: 06/16/2019: BUN 16; Creatinine, Ser 1.13; Hemoglobin 14.0; Magnesium 2.0; Platelets 269; Potassium 3.8; Sodium 138  Recent Lipid Panel    Component Value Date/Time   CHOL 192 06/16/2019 0308   TRIG 95 06/16/2019 0308   HDL 44 06/16/2019  0308   CHOLHDL 4.4 06/16/2019 0308   VLDL 19 06/16/2019 0308   LDLCALC 129 (H) 06/16/2019 0308    Physical Exam:    VS:  BP 138/84   Pulse 89   Temp 98.1 F (36.7 C)   Ht 5\' 11"  (1.803 m)   Wt 211 lb (95.7 kg)   PF 97 L/min   BMI 29.43 kg/m     Wt Readings from Last 3 Encounters:  07/01/19 211 lb (95.7 kg)  06/15/19 215 lb (97.5 kg)  01/27/19 223 lb (101.2 kg)     GEN: Well nourished, well developed in no acute distress HEENT: Normal NECK: No JVD; No carotid bruits LYMPHATICS: No lymphadenopathy CARDIAC: RRR, no murmurs, rubs, gallops RESPIRATORY:  Clear to auscultation without rales, wheezing or rhonchi  ABDOMEN: Soft, non-tender, non-distended MUSCULOSKELETAL:  No edema; No deformity  SKIN: Warm and dry NEUROLOGIC:  Alert and oriented x 3 PSYCHIATRIC:  Normal affect   ASSESSMENT:    1. Hyperlipidemia LDL goal <70   2. Atypical chest pain   3. Family history of premature coronary heart disease    PLAN:    In order of problems listed above:  Hyperlipidemia with LDL goal < 70 - given his family history of premature heart disease, recommend LDL < 70 06/16/2019: Cholesterol 192; HDL 44; LDL Cholesterol 129; Triglycerides 95; VLDL 19 - started on statin at discharge - repeat lipids in 4 weeks - he does drink 3-4 times per week up to a pint at a time - concern for LFTs - will check these in 4 weeks with lipids   Family history of premature heart disease Atypical chest pain - continue risk factor modification - thankfully, not diabetic - CT coronary reassuring   Follow up PRN. He needs to get a PCP.   Medication Adjustments/Labs and Tests Ordered: Current medicines are reviewed at length with the patient today.  Concerns regarding medicines are outlined above.  Orders Placed This Encounter  Procedures  . Lipid panel  . Hepatic function panel  . EKG 12-Lead   No orders of the defined types were placed in this encounter.   Signed, 08/14/2019,  [name]  07/01/2019 4:42 PM    [facility]

## 2019-07-01 ENCOUNTER — Ambulatory Visit (INDEPENDENT_AMBULATORY_CARE_PROVIDER_SITE_OTHER): Payer: Self-pay | Admitting: Physician Assistant

## 2019-07-01 ENCOUNTER — Other Ambulatory Visit: Payer: Self-pay

## 2019-07-01 ENCOUNTER — Encounter: Payer: Self-pay | Admitting: Physician Assistant

## 2019-07-01 VITALS — BP 138/84 | HR 89 | Temp 98.1°F | Ht 71.0 in | Wt 211.0 lb

## 2019-07-01 DIAGNOSIS — E785 Hyperlipidemia, unspecified: Secondary | ICD-10-CM

## 2019-07-01 DIAGNOSIS — R0789 Other chest pain: Secondary | ICD-10-CM

## 2019-07-01 DIAGNOSIS — Z8249 Family history of ischemic heart disease and other diseases of the circulatory system: Secondary | ICD-10-CM

## 2019-07-01 NOTE — Patient Instructions (Signed)
Medication Instructions:  Your physician recommends that you continue on your current medications as directed. Please refer to the Current Medication list given to you today.  *If you need a refill on your cardiac medications before your next appointment, please call your pharmacy*  Lab Work: Your physician recommends that you return for a FASTING lipid profile and hepatic function panel in 1 month.  If you have labs (blood work) drawn today and your tests are completely normal, you will receive your results only by: Marland Kitchen MyChart Message (if you have MyChart) OR . A paper copy in the mail If you have any lab test that is abnormal or we need to change your treatment, we will call you to review the results.  Follow-Up: At [facility], you and your health needs are our priority.  As part of our continuing mission to provide you with exceptional heart care, we have created designated Provider Care Teams.  These Care Teams include your primary Cardiologist (physician) and Advanced Practice Providers (APPs -  Physician Assistants and Nurse Practitioners) who all work together to provide you with the care you need, when you need it.  Your next appointment:   AS NEEDED

## 2019-10-24 ENCOUNTER — Encounter: Payer: Self-pay | Admitting: Emergency Medicine

## 2019-10-24 ENCOUNTER — Emergency Department
Admission: EM | Admit: 2019-10-24 | Discharge: 2019-10-24 | Disposition: A | Discharge status: Home / Self Care | Payer: Self-pay | Attending: Emergency Medicine | Admitting: Emergency Medicine

## 2019-10-24 ENCOUNTER — Other Ambulatory Visit: Payer: Self-pay

## 2019-10-24 DIAGNOSIS — F17210 Nicotine dependence, cigarettes, uncomplicated: Secondary | ICD-10-CM | POA: Insufficient documentation

## 2019-10-24 DIAGNOSIS — Z79899 Other long term (current) drug therapy: Secondary | ICD-10-CM | POA: Insufficient documentation

## 2019-10-24 DIAGNOSIS — E869 Volume depletion, unspecified: Secondary | ICD-10-CM

## 2019-10-24 DIAGNOSIS — E785 Hyperlipidemia, unspecified: Secondary | ICD-10-CM | POA: Insufficient documentation

## 2019-10-24 DIAGNOSIS — F1010 Alcohol abuse, uncomplicated: Secondary | ICD-10-CM | POA: Insufficient documentation

## 2019-10-24 DIAGNOSIS — F1410 Cocaine abuse, uncomplicated: Secondary | ICD-10-CM

## 2019-10-24 DIAGNOSIS — F419 Anxiety disorder, unspecified: Secondary | ICD-10-CM | POA: Insufficient documentation

## 2019-10-24 DIAGNOSIS — F1910 Other psychoactive substance abuse, uncomplicated: Secondary | ICD-10-CM | POA: Insufficient documentation

## 2019-10-24 HISTORY — DX: Alcohol use, unspecified, uncomplicated: F10.90

## 2019-10-24 HISTORY — DX: Other specified health status: Z78.9

## 2019-10-24 HISTORY — DX: Other problems related to lifestyle: Z72.89

## 2019-10-24 HISTORY — DX: Cocaine abuse, uncomplicated: F14.10

## 2019-10-24 LAB — CBC
HCT: 42.4 % (ref 39.0–52.0)
Hemoglobin: 14.8 g/dL (ref 13.0–17.0)
MCH: 32.5 pg (ref 26.0–34.0)
MCHC: 34.9 g/dL (ref 30.0–36.0)
MCV: 93.2 fL (ref 80.0–100.0)
Platelets: 274 10*3/uL (ref 150–400)
RBC: 4.55 MIL/uL (ref 4.22–5.81)
RDW: 12.2 % (ref 11.5–15.5)
WBC: 18.6 10*3/uL — ABNORMAL HIGH (ref 4.0–10.5)
nRBC: 0 % (ref 0.0–0.2)

## 2019-10-24 LAB — COMPREHENSIVE METABOLIC PANEL
ALT: 18 U/L (ref 0–44)
AST: 30 U/L (ref 15–41)
Albumin: 4.2 g/dL (ref 3.5–5.0)
Alkaline Phosphatase: 56 U/L (ref 38–126)
Anion gap: 10 (ref 5–15)
BUN: 9 mg/dL (ref 6–20)
CO2: 23 mmol/L (ref 22–32)
Calcium: 8.8 mg/dL — ABNORMAL LOW (ref 8.9–10.3)
Chloride: 106 mmol/L (ref 98–111)
Creatinine, Ser: 1.5 mg/dL — ABNORMAL HIGH (ref 0.61–1.24)
GFR calc Af Amer: >60 mL/min (ref >60)
GFR calc non Af Amer: >60 mL/min (ref >60)
Glucose, Bld: 85 mg/dL (ref 70–99)
Potassium: 3 mmol/L — ABNORMAL LOW (ref 3.5–5.1)
Sodium: 139 mmol/L (ref 135–145)
Total Bilirubin: 0.9 mg/dL (ref 0.3–1.2)
Total Protein: 7 g/dL (ref 6.5–8.1)

## 2019-10-24 LAB — URINE DRUG SCREEN, QUALITATIVE (ARMC ONLY)
Amphetamines, Ur Screen: NOT DETECTED
Barbiturates, Ur Screen: NOT DETECTED
Benzodiazepine, Ur Scrn: NOT DETECTED
Cannabinoid 50 Ng, Ur ~~LOC~~: POSITIVE — AB
Cocaine Metabolite,Ur ~~LOC~~: POSITIVE — AB
MDMA (Ecstasy)Ur Screen: NOT DETECTED
Methadone Scn, Ur: NOT DETECTED
Opiate, Ur Screen: NOT DETECTED
Phencyclidine (PCP) Ur S: NOT DETECTED
Tricyclic, Ur Screen: NOT DETECTED

## 2019-10-24 LAB — ACETAMINOPHEN LEVEL: Acetaminophen (Tylenol), Serum: <10 ug/mL — ABNORMAL LOW (ref 10–30)

## 2019-10-24 LAB — ETHANOL: Alcohol, Ethyl (B): <10 mg/dL (ref <10)

## 2019-10-24 LAB — SALICYLATE LEVEL: Salicylate Lvl: <7 mg/dL — ABNORMAL LOW (ref 7.0–30.0)

## 2019-10-24 NOTE — ED Triage Notes (Signed)
Pt to ED via EMS requesting DETOX from cocaine and alcohol, reports tonight he ingested cocaine. Pt is calm in triage no respiratory distress noted

## 2019-10-24 NOTE — ED Provider Notes (Signed)
[facility] Emergency Department Provider Note  ____________________________________________   First MD Initiated Contact with Patient 10/24/19 0319     (approximate)  I have reviewed the triage vital signs and the nursing notes.   HISTORY  Chief Complaint Addiction Problem and Anxiety    HPI Justin Chandler is a 34 y.o. male with cocaine abuse and alcohol use/abuse history who presents voluntarily for evaluation of cocaine addiction and seeking help with detox.  He said that for the last few days he has been using a large amount of cocaine consistently.  He had quit for a while but then he states "I chose to start back up".  He is also been using alcohol intermittently but he has never had complicated withdrawal in the form of DTs or seizures.  He has no depression or suicidal ideation or homicidal ideation.  He has no desire to hurt himself and wants help.  He has done an inpatient treatment facility in the past called [facility] that is in [place].  He is now in the [place] area and would like help.  He denies fever/chills, sore throat, chest pain, shortness of breath, nausea, vomiting, and abdominal pain.   He reports that his cocaine addiction is severe and nothing in particular makes it better or worse.        Past Medical History:  Diagnosis Date  . Alcohol use   . Cocaine abuse [facility])     Patient Active Problem List   Diagnosis Date Noted  . Hyperlipidemia 07/01/2019  . Precordial chest pain 06/15/2019    History reviewed. No pertinent surgical history.  Prior to Admission medications   Medication Sig Start Date End Date Taking? Authorizing Provider  rosuvastatin (CRESTOR) 20 MG tablet Take 1 tablet (20 mg total) by mouth daily at 6 PM. 06/17/19 08/16/19  Darlin Drop, DO    Allergies Erythromycin  Family History  Problem Relation Age of Onset  . Diabetes Father   . Hypertension Father   . Heart disease Father   . Alzheimer's  disease Other     Social History Social History   Tobacco Use  . Smoking status: Current Some Day Smoker    Packs/day: 1.00    Types: Cigarettes  . Smokeless tobacco: Never Used  Vaping Use  . Vaping Use: Never used  Substance Use Topics  . Alcohol use: No  . Drug use: Yes    Types: Cocaine    Review of Systems Constitutional: No fever/chills Eyes: No visual changes. ENT: No sore throat. Cardiovascular: Denies chest pain. Respiratory: Denies shortness of breath. Gastrointestinal: No abdominal pain.  No nausea, no vomiting.  No diarrhea.  No constipation. Genitourinary: Negative for dysuria. Musculoskeletal: Negative for neck pain.  Negative for back pain. Integumentary: Negative for rash. Neurological: Negative for headaches, focal weakness or numbness. Psychiatric:  Cocaine addiction, alcohol dependence.  Denies SI/HI.  ____________________________________________   PHYSICAL EXAM:  VITAL SIGNS: ED Triage Vitals  Enc Vitals Group     BP 10/24/19 0239 123/80     Pulse Rate 10/24/19 0239 99     Resp 10/24/19 0239 20     Temp 10/24/19 0239 98.2 F (36.8 C)     Temp Source 10/24/19 0239 Oral     SpO2 10/24/19 0239 95 %     Weight 10/24/19 0240 90.7 kg (200 lb)     Height 10/24/19 0240 1.778 m (5\' 10" )     Head Circumference --  Peak Flow --      Pain Score 10/24/19 0240 10     Pain Loc --      Pain Edu? --      Excl. in GC? --     Constitutional: Alert and oriented.  Eyes: Conjunctivae are normal.  Head: Atraumatic. Nose: No congestion/rhinnorhea. Mouth/Throat: Patient is wearing a mask. Neck: No stridor.  No meningeal signs.   Cardiovascular: Normal rate, regular rhythm. Good peripheral circulation. Grossly normal heart sounds. Respiratory: Normal respiratory effort.  No retractions. Gastrointestinal: Soft and nontender. No distention.  Musculoskeletal: No lower extremity tenderness nor edema. No gross deformities of extremities. Neurologic:  Normal  speech and language. No gross focal neurologic deficits are appreciated.  Skin:  Skin is warm, dry and intact. Psychiatric: Mood and affect are normal. Speech and behavior are normal.  Seems to have good insight into his addiction issues.  Denies SI and HI.  Calm and cooperative.  ____________________________________________   LABS (all labs ordered are listed, but only abnormal results are displayed)  Labs Reviewed  COMPREHENSIVE METABOLIC PANEL - Abnormal; Notable for the following components:      Result Value   Potassium 3.0 (*)    Creatinine, Ser 1.50 (*)    Calcium 8.8 (*)    All other components within normal limits  SALICYLATE LEVEL - Abnormal; Notable for the following components:   Salicylate Lvl <7.0 (*)    All other components within normal limits  ACETAMINOPHEN LEVEL - Abnormal; Notable for the following components:   Acetaminophen (Tylenol), Serum <10 (*)    All other components within normal limits  CBC - Abnormal; Notable for the following components:   WBC 18.6 (*)    All other components within normal limits  URINE DRUG SCREEN, QUALITATIVE ([facility] ONLY) - Abnormal; Notable for the following components:   Cocaine Metabolite,Ur [place] POSITIVE (*)    Cannabinoid 50 Ng, Ur [place] POSITIVE (*)    All other components within normal limits  SARS CORONAVIRUS 2 BY RT PCR (HOSPITAL ORDER, PERFORMED IN [facility])  ETHANOL   ____________________________________________  EKG  ED ECG REPORT I, Loleta Rose, the attending physician, personally viewed and interpreted this ECG.  Date: 10/24/2019 EKG Time: 2:37 AM Rate: 95 Rhythm: normal sinus rhythm QRS Axis: normal Intervals: normal ST/T Wave abnormalities: normal Narrative Interpretation: no evidence of acute ischemia  ____________________________________________  RADIOLOGY I, Loleta Rose, personally viewed and evaluated these images (plain radiographs) as part of my medical decision making, as well as  reviewing the written report by the radiologist.  ED MD interpretation: No indication for emergent imaging  Official radiology report(s): No results found.  ____________________________________________   PROCEDURES   Procedure(s) performed (including Critical Care):  Procedures   ____________________________________________   INITIAL IMPRESSION / MDM / ASSESSMENT AND PLAN / ED COURSE  As part of my medical decision making, I reviewed the following data within the electronic MEDICAL RECORD NUMBER Nursing notes reviewed and incorporated, Labs reviewed , EKG interpreted , Old chart reviewed, Patient signed out to Dr. Larinda Buttery, A consult was requested and obtained from this/these consultant(s) (TTS), Notes from prior ED visits and [place] Controlled Substance Database   Differential diagnosis includes, but is not limited to, stimulant abuse syndrome, adjustment disorder, mood disorder, depression.  The patient reports no medical signs or symptoms and he is calm, cooperative, and has good insight into his addiction issues.  I am not concerned that he represents a danger to himself or others.  I will consult TTS to see if they have any locations such as [facility] that he could be placed for in-house treatment.  The patient is medically cleared.  Has some mild hypokalemia and an elevated creatinine likely due to decreased oral intake during his cocaine binge, but he is able to tolerate oral intake without difficulty and can rehydrate himself adequately.  His CBC demonstrates a leukocytosis of 18.6 which is again likely secondary to the cocaine binge in the absence of any infectious signs or symptoms.  No detectable alcohol in his system and no sign of alcohol withdrawal.  The patient is comfortable with the plan for placement at [facility] or with outpatient resources if no inpatient beds are available.       Clinical Course as of Oct 23 848  Sat Oct 24, 2019  0645 Evaluated by TTS who recommended having the  dayshift reassess him.  [facility] is uncertain about bed status but should have more information by the morning.   [CF]  9381 Transferred ED care to Dr. Charna Archer to follow-up on TTS recommendations.   [CF]    Clinical Course User Index [CF] Hinda Kehr, MD     ____________________________________________  FINAL CLINICAL IMPRESSION(S) / ED DIAGNOSES  Final diagnoses:  Cocaine abuse ([place])  Volume depletion     MEDICATIONS GIVEN DURING THIS VISIT:  Medications - No data to display   ED Discharge Orders    None      *Please note:  SANDRA TELLEFSEN was evaluated in Emergency Department on 10/24/2019 for the symptoms described in the history of present illness. He was evaluated in the context of the global COVID-19 pandemic, which necessitated consideration that the patient might be at risk for infection with the SARS-CoV-2 virus that causes COVID-19. Institutional protocols and algorithms that pertain to the evaluation of patients at risk for COVID-19 are in a state of rapid change based on information released by regulatory bodies including the CDC and federal and state organizations. These policies and algorithms were followed during the patient's care in the ED.  Some ED evaluations and interventions may be delayed as a result of limited staffing during and after the pandemic.*  Note:  This document was prepared using Dragon voice recognition software and may include unintentional dictation errors.   Hinda Kehr, MD 10/24/19 [phone]

## 2019-10-24 NOTE — ED Provider Notes (Signed)
-----------------------------------------   7:50 AM on 10/24/2019 -----------------------------------------  Blood pressure 113/73, pulse 62, temperature 98.2 F (36.8 C), temperature source Oral, resp. rate (!) 22, height 5\' 10"  (1.778 m), weight 90.7 kg, SpO2 95 %.  Assuming care from Dr. .  In short, Justin Chandler is a 34 y.o. male with a chief complaint of Addiction Problem and Anxiety .  Refer to the original H&P for additional details.  The current plan of care is to follow recommendations from TTS for potential rehab placement.  ----------------------------------------- 9:58 AM on 10/24/2019 -----------------------------------------  In further discussion with TTS, there are reportedly no beds available for residential treatment services at this time and unclear if beds will become available tomorrow.  Patient was advised of this and would like to be discharged home at this time.  He was provided with referral for outpatient substance abuse treatment, counseled to return to the ED for new worsening symptoms.  Patient agrees with plan.    10/26/2019, MD 10/24/19 [id number]

## 2019-10-24 NOTE — ED Notes (Signed)
Patient given phone to call ride for discharge. 

## 2019-10-24 NOTE — BH Assessment (Signed)
Writer followed up with referrals:  [facility] ([phone]). No available beds today (10/24/2019). Possible beds tomorrow (10/25/2019) but no guarantee.  [facility] [phone]) no beds beds till Monday (10/26/2019), but advised to checked back tomorrow (10/25/2019) in the event someone leaves AMA.  Writer updated patient's nurse Catering manager).

## 2019-10-24 NOTE — ED Notes (Signed)
Patient reports using cocaine and alcohol the past 3 days after being clean for approximately 2 years.  Patient reports tonight he actually ingested large amount of cocaine and had hallucinations and feeling really hot.  Patient is requesting detox.

## 2019-10-24 NOTE — BH Assessment (Signed)
Assessment Note  Justin Chandler is an 34 y.o. male presenting to [facility] ED voluntarily requesting detox treatment. Pt to ED via EMS requesting detox after using cocaine and alcohol tonight. Pt reports he had been 2 years clean from Cocaine until two weeks ago when pt had stressors that caused him to use again. Pt has smoked cocaine for the past three nights until tonight he ingested the cocaine. During assessment patient was alert and oriented, calm and cooperative. Patient reported that he has been using Cocaine for the past 2 weeks but has been using daily for the past 3-4 days. Patient reports he was clean for 2 years prior and reports the reason he used "I just chose to use." Patient reports the amounts he has been using "2 1/2 grams" and reports he used cocaine orally today but has a history of using via inhaling and smoking. Patient reports his last date of use being today 10/24/19. Patient also reports use of marijuana and alcohol. Patient reports he last used marijuana "3-4 days" and reports smoking "1-2 hits then I put it down." Patient reports alcohol use today and reports drinking "2 beers." Patient reports receiving detox treatment from [facility] "that was 9-10 years ago." Patient UDS is positive for Cocaine and Cannabinoids. Patient BAL is negative. Patient denies SI/HI/AH/VH and is not responding to any internal or external stimuli.  Patient is currently requesting detox treatment and verbally consented to referrals being made to appropriate facilities.  Diagnosis: Polysubstance Abuse  Past Medical History: History reviewed. No pertinent past medical history.  History reviewed. No pertinent surgical history.  Family History:  Family History  Problem Relation Age of Onset  . Diabetes Father   . Hypertension Father   . Heart disease Father   . Alzheimer's disease Other     Social History:  reports that he has been smoking cigarettes. He has been smoking about 1.00 pack per day. He has never  used smokeless tobacco. He reports current drug use. Drug: Cocaine. He reports that he does not drink alcohol.  Additional Social History:  Alcohol / Drug Use Pain Medications: See MAR Prescriptions: See MAR Over the Counter: See MAR History of alcohol / drug use?: Yes Substance #1 Name of Substance 1: Cocaine Substance #2 Name of Substance 2: Marijuana  CIWA: CIWA-Ar BP: 123/80 Pulse Rate: 71 COWS:    Allergies:  Allergies  Allergen Reactions  . Erythromycin Anaphylaxis    Home Medications: (Not in a hospital admission)   OB/GYN Status:  No LMP for male patient.  General Assessment Data Location of Assessment: [facility] ED TTS Assessment: In system Is this a Tele or Face-to-Face Assessment?: Face-to-Face Is this an Initial Assessment or a Re-assessment for this encounter?: Initial Assessment Patient Accompanied by:: N/A Language Other than English: No Living Arrangements: Other (Comment) (Private Residence) What gender do you identify as?: Male Marital status: Single Maiden name: . Living Arrangements: Alone Can pt return to current living arrangement?: Yes Admission Status: Voluntary Is patient capable of signing voluntary admission?: Yes Referral Source: Self/Family/Friend Insurance type: None  Medical Screening Exam ([place]) Medical Exam completed: Yes  Crisis Care Plan Living Arrangements: Alone Legal Guardian: Other: (Self) Name of Psychiatrist: None Name of Therapist: None  Education Status Is patient currently in school?: No Is the patient employed, unemployed or receiving disability?: Employed  Risk to self with the past 6 months Suicidal Ideation: No Has patient been a risk to self within the past 6 months prior to  admission? : No Suicidal Intent: No Has patient had any suicidal intent within the past 6 months prior to admission? : No Is patient at risk for suicide?: No Suicidal Plan?: No Has patient had any suicidal plan within the  past 6 months prior to admission? : No Access to Means: No What has been your use of drugs/alcohol within the last 12 months?: Cocaine, Marijuana, Alcohol Previous Attempts/Gestures: No How many times?: 0 Triggers for Past Attempts: None known Intentional Self Injurious Behavior: None Family Suicide History: No Recent stressful life event(s): Other (Comment) (None reported) Persecutory voices/beliefs?: No Depression: No Substance abuse history and/or treatment for substance abuse?: Yes Suicide prevention information given to non-admitted patients: Not applicable  Risk to Others within the past 6 months Homicidal Ideation: No Does patient have any lifetime risk of violence toward others beyond the six months prior to admission? : No Thoughts of Harm to Others: No Current Homicidal Intent: No Current Homicidal Plan: No Access to Homicidal Means: No Identified Victim: None reported History of harm to others?: No Assessment of Violence: None Noted Violent Behavior Description: None Does patient have access to weapons?: No Criminal Charges Pending?: No Does patient have a court date: No Is patient on probation?: No  Psychosis Hallucinations: None noted Delusions: None noted  Mental Status Report Appearance/Hygiene: In scrubs Eye Contact: Fair Motor Activity: Freedom of movement Speech: Logical/coherent Level of Consciousness: Alert Mood: Pleasant Affect: Appropriate to circumstance Anxiety Level: None Thought Processes: Coherent Judgement: Unimpaired Orientation: Person, Place, Time, Situation, Appropriate for developmental age Obsessive Compulsive Thoughts/Behaviors: None  Cognitive Functioning Concentration: Normal Memory: Recent Intact, Remote Intact Is patient IDD: No Insight: Good Impulse Control: Fair Appetite: Good Have you had any weight changes? : No Change Sleep: Decreased Total Hours of Sleep: 0 Vegetative Symptoms: None  ADLScreening [facility] Assessment  Services) Patient's cognitive ability adequate to safely complete daily activities?: Yes Patient able to express need for assistance with ADLs?: Yes Independently performs ADLs?: Yes (appropriate for developmental age)  Prior Inpatient Therapy Prior Inpatient Therapy: Yes Prior Therapy Dates: Unknown Prior Therapy Facilty/Provider(s): [facility] Reason for Treatment: Substance Abuse Detox  Prior Outpatient Therapy Prior Outpatient Therapy: No Does patient have an ACCT team?: No Does patient have Intensive In-House Services?  : No Does patient have [facility] services? : No Does patient have P4CC services?: No  ADL Screening (condition at time of admission) Patient's cognitive ability adequate to safely complete daily activities?: Yes Is the patient deaf or have difficulty hearing?: No Does the patient have difficulty seeing, even when wearing glasses/contacts?: No Does the patient have difficulty concentrating, remembering, or making decisions?: No Patient able to express need for assistance with ADLs?: Yes Does the patient have difficulty dressing or bathing?: No Independently performs ADLs?: Yes (appropriate for developmental age) Does the patient have difficulty walking or climbing stairs?: No Weakness of Legs: None Weakness of Arms/Hands: None  Home Assistive Devices/Equipment Home Assistive Devices/Equipment: None  Therapy Consults (therapy consults require a physician order) PT Evaluation Needed: No OT Evalulation Needed: No SLP Evaluation Needed: No Abuse/Neglect Assessment (Assessment to be complete while patient is alone) Abuse/Neglect Assessment Can Be Completed: Yes Physical Abuse: Denies Verbal Abuse: Denies Sexual Abuse: Denies Exploitation of patient/patient's resources: Denies Self-Neglect: Denies Values / Beliefs Cultural Requests During Hospitalization: None Spiritual Requests During Hospitalization: None Consults Spiritual Care Consult Needed: No Transition of  Care Team Consult Needed: No Advance Directives (For Healthcare) Does Patient Have a Medical Advance Directive?: No Would patient like information  on creating a medical advance directive?: No - Patient declined          Disposition: Patient is currently requesting detox treatment and verbally consented to referrals being made to appropriate facilities. Disposition Initial Assessment Completed for this Encounter: Yes Patient referred to: [facility]  On Site Evaluation by:   Reviewed with Physician:    Benay Pike MS LCASA 10/24/2019 6:03 AM

## 2019-10-24 NOTE — ED Triage Notes (Signed)
Pt to ED via EMS requesting detox after using cocaine and alcohol tonight. Pt reports he had been 2 years clean from Cocaine until two weeks ago when pt had stressors that caused him to use again. Pt has smoked cocaine for the past three nights until tonight he ingested the cocaine.

## 2019-10-24 NOTE — ED Notes (Signed)
Breakfast tray given. °

## 2019-10-24 NOTE — BH Assessment (Signed)
Patient has been faxed to following facilities:   [facility] ([phone]) Staff reports to contact back after 9am today  [facility] [phone] or [phone])

## 2019-10-24 NOTE — ED Notes (Signed)
Pt has a black underwear in pt's belongings bag.

## 2020-08-22 ENCOUNTER — Ambulatory Visit: Payer: Self-pay

## 2021-05-20 IMAGING — CT CT HEART MORP W/ CTA COR W/ SCORE W/ CA W/CM &/OR W/O CM
4 of 7 series · 8 of 20 positions shown, 9 images · IV contrast (APPLIED)
Comparison: Chest radiograph 06/15/2019 and CT abdomen 07/10/2012
COMPARISON: Chest radiograph 06/15/2019 and CT abdomen 07/10/2012

Addendum:
EXAM:
OVER-READ INTERPRETATION  CT CHEST

The following report is an over-read performed by radiologist Dr.
Gyunesh Tixhon [REDACTED] on 06/16/2019. This over-read
does not include interpretation of cardiac or coronary anatomy or
pathology. The coronary calcium score/coronary CTA interpretation by
the cardiologist is attached.
CLINICAL DATA: Chest pain
Cardiac/Coronary CTA
TECHNIQUE: The patient was scanned on a [employer] Force scanner. A 100 kV
prospective scan was triggered in the descending thoracic aorta at
111 HU's. Axial non-contrast 3 mm slices were carried out through
the heart. The data set was analyzed on a dedicated work station and
scored using the Agatson method. Gantry rotation speed was 250 msecs
and collimation was .6 mm. No beta blockade and 0.8 mg of sl NTG was
given. The 3D data set was reconstructed in 5% intervals of the
35-75 % of the R-R cycle. Diastolic phases were analyzed on a
dedicated work station using MPR, MIP and VRT modes. The patient
received 80 cc of contrast.

[Series 6: best diast 74 % · axial · 0.39mm/px · z∈[+1215,+1265]mm · 2 of 378 slices shown, 3 images]
[im 126/378  vessel]
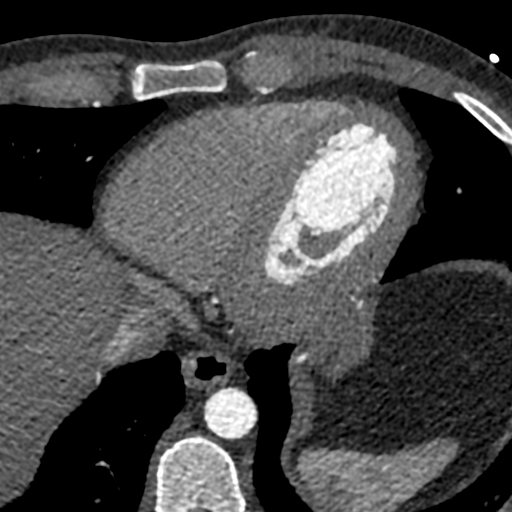
[im 126/378  lung]
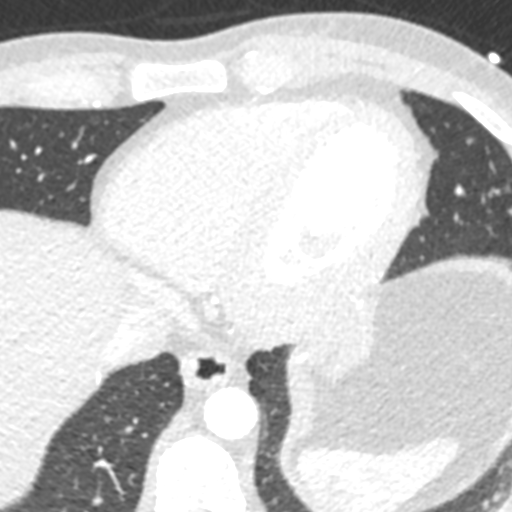
[im 252/378  vessel]
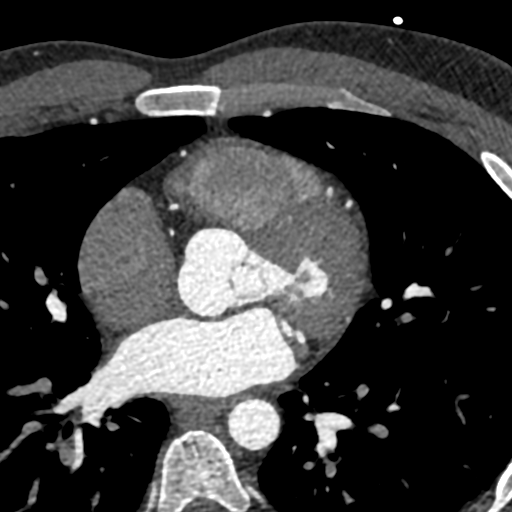

[Series 7: best syst 34 % · axial · 0.39mm/px · z∈[+1215,+1265]mm · 2 of 378 slices shown]
[im 126/378  vessel]
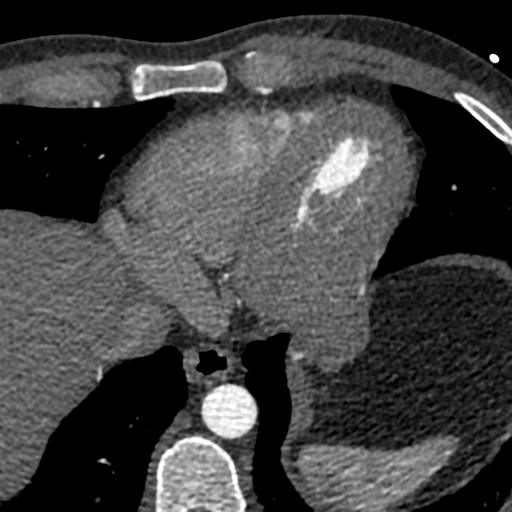
[im 252/378  vessel]
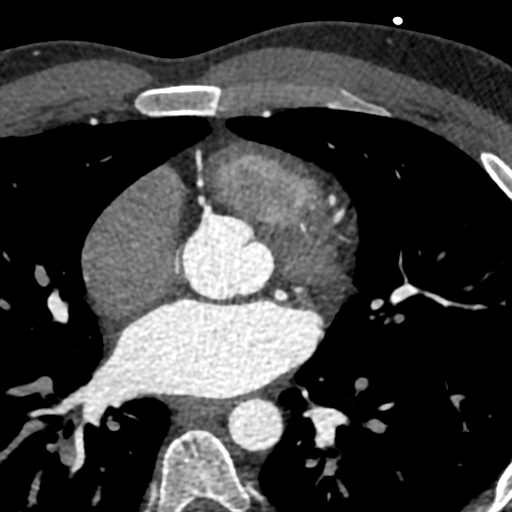

[Series 8: ts diast sharp 34 % · axial · 0.39mm/px · z∈[+1215,+1265]mm · 2 of 378 slices shown]
[im 126/378  lung]
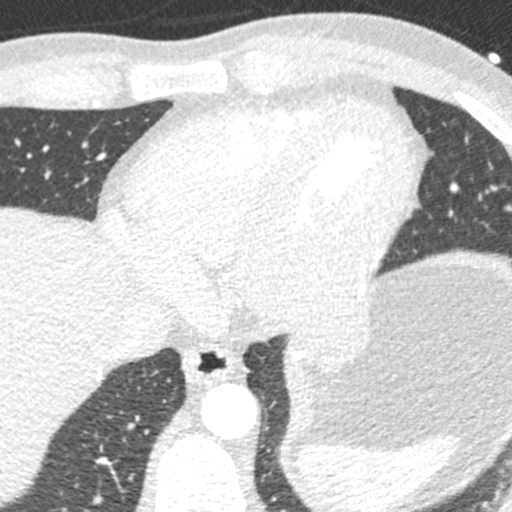
[im 252/378  lung]
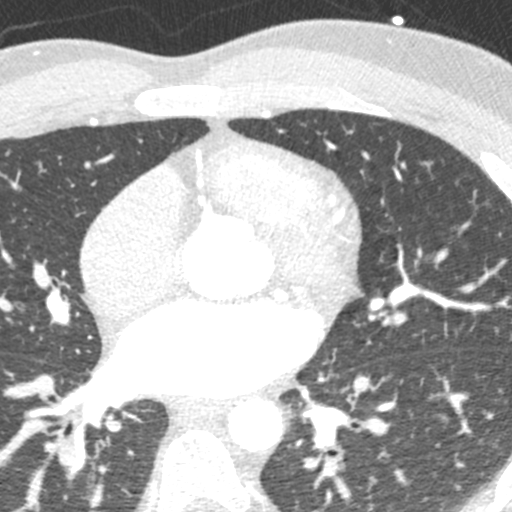

[Series 9: ts syst sharp 34 % · axial · 0.39mm/px · z∈[+1215,+1265]mm · 2 of 378 slices shown]
[im 126/378  lung]
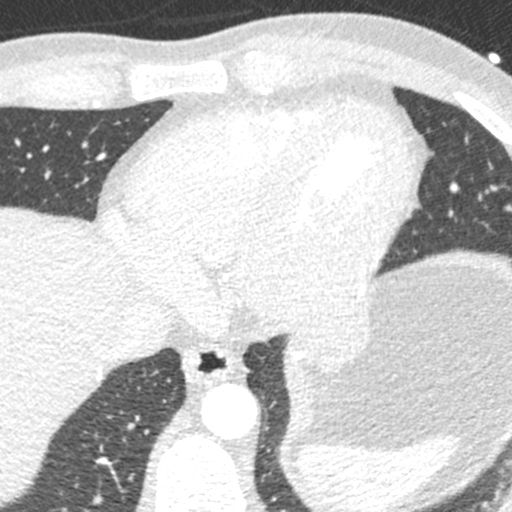
[im 252/378  lung]
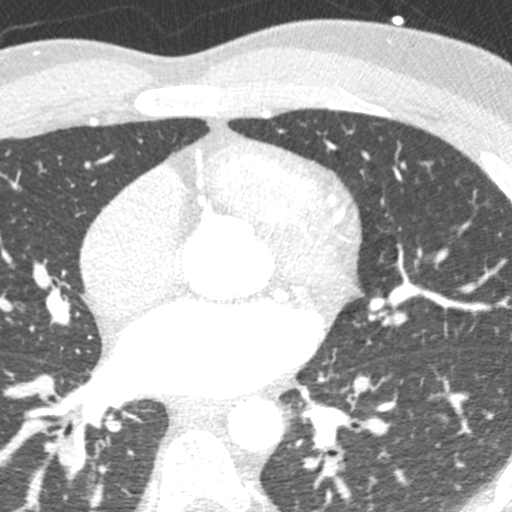

[8 of 20 positions shown; findings below may reference images not displayed]

FINDINGS: Vascular: Normal caliber of the visualized thoracic aorta. No
significant pericardial fluid. Normal caliber of the main pulmonary
arteries.

Mediastinum/Nodes: Visualized mediastinal structures are normal
limits.

Lungs/Pleura: Visualized lungs are clear. Mild dependent atelectasis
in the lower lungs. No large pleural effusions.

Upper Abdomen: 1.0 cm hypodensity at the hepatic dome is
nonspecific. This hypodensity was not clearly visible on the prior
examination. Otherwise, images of upper abdomen are unremarkable.

Musculoskeletal: No acute bone abnormality.
IMPRESSION: 1. No acute abnormality.
2. Question a 1 cm hypodensity at the hepatic dome. This finding is
indeterminate but likely incidental.
FINDINGS: Image quality: Excellent.

Noise artifact is: Limited.

Coronary Arteries:  Normal coronary origin. Left dominance.

Left main: The left main is a short, large caliber vessel with a
normal take off from the left coronary cusp that bifurcates to form
a left anterior descending artery and a left circumflex artery.
There is no plaque or stenosis.

Left anterior descending artery: The LAD is patent without evidence
of plaque or stenosis. The LAD gives off 2 patent diagonal branches.

Left circumflex artery: The LCX is dominant and patent with no
evidence of plaque or stenosis. The LCX gives off 2 patent obtuse
marginal branches. The LCX terminates as a PDA without evidence of
plaque or stenosis

Right coronary artery: The RCA is non-dominant with normal take off
from the right coronary cusp. There is no evidence of plaque or
stenosis.

Right Atrium: Right atrial size is within normal limits.

Right Ventricle: The right ventricular cavity is within normal
limits.

Left Atrium: Left atrial size is normal in size with no left atrial
appendage filling defect. There is a small diverticulum in the
inferior/posterior aspect of the SJAD. This is benign, incidental
finding.

Left Ventricle: The ventricular cavity size is within normal limits.
There are no stigmata of prior infarction. There is no abnormal
filling defect.

Pulmonary arteries: Normal in size without proximal filling defect.

Pulmonary veins: Normal pulmonary venous drainage.

Pericardium: Normal thickness with no significant effusion or
calcium present.

Cardiac valves: The aortic valve is trileaflet without significant
calcification. The mitral valve is normal structure without
significant calcification.

Aorta: Normal caliber with no significant disease.

Extra-cardiac findings: Hiatal hernia noted. See attached radiology
report for non-cardiac structures.
IMPRESSION: 1. Coronary calcium score of 0.

2. Normal coronary origin with left dominance.

3. Normal coronary arteries.

RECOMMENDATIONS:
1. No evidence of CAD (0%). Consider non-atherosclerotic causes of
chest pain.

*** End of Addendum ***
EXAM:
OVER-READ INTERPRETATION  CT CHEST

The following report is an over-read performed by radiologist Dr.
Gyunesh Tixhon [REDACTED] on 06/16/2019. This over-read
does not include interpretation of cardiac or coronary anatomy or
pathology. The coronary calcium score/coronary CTA interpretation by
the cardiologist is attached.
FINDINGS: Vascular: Normal caliber of the visualized thoracic aorta. No
significant pericardial fluid. Normal caliber of the main pulmonary
arteries.

Mediastinum/Nodes: Visualized mediastinal structures are normal
limits.

Lungs/Pleura: Visualized lungs are clear. Mild dependent atelectasis
in the lower lungs. No large pleural effusions.

Upper Abdomen: 1.0 cm hypodensity at the hepatic dome is
nonspecific. This hypodensity was not clearly visible on the prior
examination. Otherwise, images of upper abdomen are unremarkable.

Musculoskeletal: No acute bone abnormality.
IMPRESSION: 1. No acute abnormality.
2. Question a 1 cm hypodensity at the hepatic dome. This finding is
indeterminate but likely incidental.

## 2022-12-22 ENCOUNTER — Other Ambulatory Visit: Payer: Self-pay

## 2022-12-22 ENCOUNTER — Emergency Department
Admission: EM | Admit: 2022-12-22 | Discharge: 2022-12-22 | Disposition: A | Discharge status: Home / Self Care | Payer: Self-pay | Attending: Emergency Medicine | Admitting: Emergency Medicine

## 2022-12-22 ENCOUNTER — Emergency Department: Payer: Self-pay

## 2022-12-22 DIAGNOSIS — Y92410 Unspecified street and highway as the place of occurrence of the external cause: Secondary | ICD-10-CM | POA: Insufficient documentation

## 2022-12-22 DIAGNOSIS — M25571 Pain in right ankle and joints of right foot: Secondary | ICD-10-CM | POA: Insufficient documentation

## 2022-12-22 MED ORDER — OXYCODONE-ACETAMINOPHEN 5-325 MG PO TABS
1.0000 | ORAL_TABLET | Freq: Once | ORAL | Status: AC
  Administered 2022-12-22: 1 via ORAL
  Filled 2022-12-22: qty 1

## 2022-12-22 NOTE — ED Notes (Signed)
D/c was done my Casimiro Needle, [employer] fundraiser. This RN is removing pt from track board only.
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
## 2023-01-16 ENCOUNTER — Encounter: Payer: Self-pay | Admitting: Cardiology
